# Patient Record
Sex: Male | Born: 1935
Health system: Southern US, Community
[De-identification: ages and names within clinical notes are randomized; demographics above are authoritative.]

## PROBLEM LIST (undated history)

## (undated) DIAGNOSIS — F0280 Dementia in other diseases classified elsewhere without behavioral disturbance: Secondary | ICD-10-CM

## (undated) DIAGNOSIS — S71132S Puncture wound without foreign body, left thigh, sequela: Secondary | ICD-10-CM

## (undated) DIAGNOSIS — H3320 Serous retinal detachment, unspecified eye: Secondary | ICD-10-CM

## (undated) DIAGNOSIS — N183 Chronic kidney disease, stage 3 unspecified: Secondary | ICD-10-CM

## (undated) DIAGNOSIS — M549 Dorsalgia, unspecified: Secondary | ICD-10-CM

## (undated) DIAGNOSIS — M1811 Unilateral primary osteoarthritis of first carpometacarpal joint, right hand: Secondary | ICD-10-CM

## (undated) DIAGNOSIS — E7800 Pure hypercholesterolemia, unspecified: Secondary | ICD-10-CM

## (undated) DIAGNOSIS — S32030A Wedge compression fracture of third lumbar vertebra, initial encounter for closed fracture: Secondary | ICD-10-CM

## (undated) DIAGNOSIS — W3400XS Accidental discharge from unspecified firearms or gun, sequela: Secondary | ICD-10-CM

## (undated) HISTORY — DX: Pure hypercholesterolemia, unspecified: E78.00

## (undated) HISTORY — DX: Serous retinal detachment, unspecified eye: H33.20

## (undated) HISTORY — DX: Dorsalgia, unspecified: M54.9

## (undated) HISTORY — DX: Chronic kidney disease, stage 3 (moderate): N18.3

## (undated) HISTORY — DX: Unilateral primary osteoarthritis of first carpometacarpal joint, right hand: M18.11

## (undated) HISTORY — DX: Puncture wound without foreign body, left thigh, sequela: S71.132S

## (undated) HISTORY — DX: Chronic kidney disease, stage 3 unspecified: N18.30

## (undated) HISTORY — DX: Wedge compression fracture of third lumbar vertebra, initial encounter for closed fracture: S32.030A

## (undated) HISTORY — DX: Accidental discharge from unspecified firearms or gun, sequela: W34.00XS

---
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
## 2008-04-14 DIAGNOSIS — S71132S Puncture wound without foreign body, left thigh, sequela: Secondary | ICD-10-CM

## 2008-04-14 HISTORY — DX: Puncture wound without foreign body, left thigh, sequela: S71.132S

## 2008-09-02 ENCOUNTER — Emergency Department (HOSPITAL_COMMUNITY): Admission: EM | Admit: 2008-09-02 | Discharge: 2008-09-02 | Payer: Self-pay | Admitting: Emergency Medicine

## 2008-09-02 ENCOUNTER — Encounter: Payer: Self-pay | Admitting: Emergency Medicine

## 2010-01-29 LAB — BASIC METABOLIC PANEL: Glucose: 96 mg/dL

## 2010-01-29 LAB — LIPID PANEL
CHOLESTEROL: 211 mg/dL — AB (ref 0–200)
HDL: 56 mg/dL (ref 35–70)
LDL CALC: 137 mg/dL
Triglycerides: 61 mg/dL (ref 40–160)

## 2010-08-13 HISTORY — PX: COLONOSCOPY: SHX174

## 2010-08-13 LAB — HM COLONOSCOPY

## 2011-02-26 LAB — HEPATIC FUNCTION PANEL
ALT: 15 U/L (ref 10–40)
AST: 21 U/L (ref 14–40)
Alkaline Phosphatase: 60 U/L (ref 25–125)
BILIRUBIN, TOTAL: 1.2 mg/dL

## 2011-02-26 LAB — BASIC METABOLIC PANEL
BUN: 24 mg/dL — AB (ref 4–21)
Creatinine: 1.3 mg/dL (ref 0.6–1.3)
GLUCOSE: 84 mg/dL
POTASSIUM: 4.4 mmol/L (ref 3.4–5.3)
SODIUM: 140 mmol/L (ref 137–147)

## 2011-02-26 LAB — LIPID PANEL
Cholesterol: 207 mg/dL — AB (ref 0–200)
HDL: 151 mg/dL — AB (ref 35–70)
LDL CALC: 143 mg/dL

## 2011-09-30 DIAGNOSIS — H43819 Vitreous degeneration, unspecified eye: Secondary | ICD-10-CM | POA: Insufficient documentation

## 2011-09-30 DIAGNOSIS — Z961 Presence of intraocular lens: Secondary | ICD-10-CM | POA: Insufficient documentation

## 2012-04-14 HISTORY — PX: CATARACT EXTRACTION: SUR2

## 2012-04-21 DIAGNOSIS — Z0000 Encounter for general adult medical examination without abnormal findings: Secondary | ICD-10-CM | POA: Diagnosis not present

## 2012-04-21 DIAGNOSIS — E7800 Pure hypercholesterolemia, unspecified: Secondary | ICD-10-CM | POA: Diagnosis not present

## 2012-04-21 DIAGNOSIS — J310 Chronic rhinitis: Secondary | ICD-10-CM | POA: Diagnosis not present

## 2012-04-21 DIAGNOSIS — R7989 Other specified abnormal findings of blood chemistry: Secondary | ICD-10-CM | POA: Diagnosis not present

## 2012-04-21 DIAGNOSIS — Z1331 Encounter for screening for depression: Secondary | ICD-10-CM | POA: Diagnosis not present

## 2012-04-21 LAB — HEPATIC FUNCTION PANEL
ALK PHOS: 67 U/L (ref 25–125)
ALT: 15 U/L (ref 10–40)
AST: 19 U/L (ref 14–40)
Bilirubin, Total: 0.9 mg/dL

## 2012-04-21 LAB — BASIC METABOLIC PANEL
BUN: 24 mg/dL — AB (ref 4–21)
Creatinine: 1.4 mg/dL — AB (ref 0.6–1.3)
Glucose: 79 mg/dL
Potassium: 4.3 mmol/L (ref 3.4–5.3)
SODIUM: 140 mmol/L (ref 137–147)

## 2012-04-21 LAB — LIPID PANEL
CHOLESTEROL: 221 mg/dL — AB (ref 0–200)
HDL: 155 mg/dL — AB (ref 35–70)
LDL CALC: 138 mg/dL

## 2012-10-05 DIAGNOSIS — H2640 Unspecified secondary cataract: Secondary | ICD-10-CM | POA: Diagnosis not present

## 2012-10-05 DIAGNOSIS — Z961 Presence of intraocular lens: Secondary | ICD-10-CM | POA: Diagnosis not present

## 2012-10-05 DIAGNOSIS — H3320 Serous retinal detachment, unspecified eye: Secondary | ICD-10-CM | POA: Diagnosis not present

## 2012-10-05 DIAGNOSIS — H43819 Vitreous degeneration, unspecified eye: Secondary | ICD-10-CM | POA: Diagnosis not present

## 2012-12-20 DIAGNOSIS — H26499 Other secondary cataract, unspecified eye: Secondary | ICD-10-CM | POA: Diagnosis not present

## 2012-12-20 DIAGNOSIS — Z9849 Cataract extraction status, unspecified eye: Secondary | ICD-10-CM | POA: Diagnosis not present

## 2012-12-20 DIAGNOSIS — Z961 Presence of intraocular lens: Secondary | ICD-10-CM | POA: Diagnosis not present

## 2013-04-14 HISTORY — PX: RETINAL TEAR REPAIR CRYOTHERAPY: SHX5304

## 2013-04-20 DIAGNOSIS — Z961 Presence of intraocular lens: Secondary | ICD-10-CM | POA: Diagnosis not present

## 2013-04-20 DIAGNOSIS — H43819 Vitreous degeneration, unspecified eye: Secondary | ICD-10-CM | POA: Diagnosis not present

## 2013-04-26 DIAGNOSIS — Z23 Encounter for immunization: Secondary | ICD-10-CM | POA: Diagnosis not present

## 2013-04-26 DIAGNOSIS — E7800 Pure hypercholesterolemia, unspecified: Secondary | ICD-10-CM | POA: Diagnosis not present

## 2013-04-26 DIAGNOSIS — M7710 Lateral epicondylitis, unspecified elbow: Secondary | ICD-10-CM | POA: Diagnosis not present

## 2013-04-26 DIAGNOSIS — Z1331 Encounter for screening for depression: Secondary | ICD-10-CM | POA: Diagnosis not present

## 2013-04-26 DIAGNOSIS — N183 Chronic kidney disease, stage 3 unspecified: Secondary | ICD-10-CM | POA: Diagnosis not present

## 2013-04-26 DIAGNOSIS — H6980 Other specified disorders of Eustachian tube, unspecified ear: Secondary | ICD-10-CM | POA: Diagnosis not present

## 2013-04-26 LAB — BASIC METABOLIC PANEL
BUN: 19 mg/dL (ref 4–21)
Creatinine: 1.3 mg/dL (ref 0.6–1.3)
GLUCOSE: 88 mg/dL
Potassium: 4.2 mmol/L (ref 3.4–5.3)
SODIUM: 138 mmol/L (ref 137–147)

## 2013-04-26 LAB — HEPATIC FUNCTION PANEL
ALK PHOS: 59 U/L (ref 25–125)
ALT: 11 U/L (ref 10–40)
AST: 17 U/L (ref 14–40)
Bilirubin, Total: 1 mg/dL

## 2013-04-26 LAB — LIPID PANEL
CHOLESTEROL: 188 mg/dL (ref 0–200)
HDL: 59 mg/dL (ref 35–70)
LDL CALC: 112 mg/dL
TRIGLYCERIDES: 82 mg/dL (ref 40–160)

## 2013-06-11 ENCOUNTER — Ambulatory Visit (INDEPENDENT_AMBULATORY_CARE_PROVIDER_SITE_OTHER): Payer: Medicare Other | Admitting: Family Medicine

## 2013-06-11 VITALS — BP 118/78 | HR 76 | Temp 98.0°F | Resp 16 | Ht 71.0 in | Wt 183.0 lb

## 2013-06-11 DIAGNOSIS — J0100 Acute maxillary sinusitis, unspecified: Secondary | ICD-10-CM | POA: Diagnosis not present

## 2013-06-11 DIAGNOSIS — R05 Cough: Secondary | ICD-10-CM | POA: Diagnosis not present

## 2013-06-11 DIAGNOSIS — R059 Cough, unspecified: Secondary | ICD-10-CM | POA: Diagnosis not present

## 2013-06-11 MED ORDER — AMOXICILLIN-POT CLAVULANATE 875-125 MG PO TABS: 1.0000 | ORAL_TABLET | Freq: Two times a day (BID) | ORAL | Status: DC

## 2013-06-11 MED ORDER — IPRATROPIUM BROMIDE 0.03 % NA SOLN: 2.0000 | Freq: Two times a day (BID) | NASAL | Status: DC

## 2013-06-11 MED ORDER — BENZONATATE 100 MG PO CAPS: 100.0000 mg | ORAL_CAPSULE | Freq: Three times a day (TID) | ORAL | Status: DC | PRN

## 2013-06-11 NOTE — Patient Instructions (Signed)

## 2013-06-11 NOTE — Progress Notes (Addendum)
Subjective:    Patient ID: Alfred Clark, male    DOB: 08-17-1935, 78 y.o.   MRN: [id number]  HPI Chief Complaint  Patient presents with  . Cough    x 1 week    This chart was scribed for Reginia Forts, MD by Thea Alken, ED Scribe. This patient was seen in room 3 and the patient's care was started at 8:46 AM.  HPI Comments: Alfred Clark is a 78 y.o. male who presents to the [facility] complaining of a productive cough consisting of light brown liquid onset 10 days, but worsened 3 days ago. He reports that he has coughing fits, chest congestion and reports burning when coughing. He states that he coughs more during the night than the day. He also reports chronic rhinorrhea. Pt reports that he has taken Mucinex. He states that he takes  2 in the morning and 2 at night.   Pt states that he has congestion in his ears but reports ear problems chronically due to years of flying. Pt reports congestion worse in right ear than left. Pt wears a hearing aid. Pt received flu shot 2 months ago and is due for a pneumonia vaccine next year.  Pt has family history of glaucoma. He reports bilateral cataract surgery.   He denies sore throat, fever, diaphoresis, SOB  and chills.  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    History reviewed. No pertinent past medical history. Allergies  Allergen Reactions  . Sulfa Antibiotics Rash   Prior to Admission medications   Not on File   Review of Systems  Constitutional: Negative for fever, chills and diaphoresis.  HENT: Positive for congestion and rhinorrhea. Negative for ear pain and sore throat.   Respiratory: Positive for cough. Negative for shortness of breath.    History   Social History  . Marital Status: Married    Spouse Name: N/A    Number of Children: N/A  . Years of Education: N/A   Occupational History  . Not on file.   Social History Main Topics  . Smoking status: Never Smoker   .  Smokeless tobacco: Not on file  . Alcohol Use: No  . Drug Use: No  . Sexual Activity: Yes   Other Topics Concern  . Not on file   Social History Narrative  . No narrative on file       Objective:   Physical Exam  Nursing note and vitals reviewed. Constitutional: He is oriented to person, place, and time. He appears well-developed and well-nourished. No distress.  HENT:  Head: Normocephalic and atraumatic.  Nose: Rhinorrhea present.  Mouth/Throat: No oropharyngeal exudate, posterior oropharyngeal edema or posterior oropharyngeal erythema.  Eyes: EOM are normal.  Neck: Neck supple. No tracheal deviation present.  No sinus tenderness  Cardiovascular: Normal rate, regular rhythm and normal heart sounds.  Exam reveals no gallop and no friction rub.   No murmur heard. Pulmonary/Chest: Effort normal and breath sounds normal. No respiratory distress. He has no wheezes. He has no rales.  Scattered bronchi on right. Good air movement  Musculoskeletal: Normal range of motion.  Lymphadenopathy:    He has no cervical adenopathy.  Neurological: He is alert and oriented to person, place, and time.  Skin: Skin is warm and dry.  Psychiatric: He has a normal mood and affect. His behavior is normal.   Filed Vitals:   06/11/13 0810  BP: 118/78  Pulse: 76  Temp: 98 F (36.7 C)  Resp: 16      Assessment & Plan:   1. Acute maxillary sinusitis   2. Cough    1. Acute maxillary sinusitis:  New.  Rx for Augmentin, Atrovent nasal spray. Continue Mucinex DM. 2.  Cough:  New.  Worse at night; continue Mucinex DM; rx for Tessalon Perles provided.  RTC for SOB, acute worsening. Meds ordered this encounter  Medications  . amoxicillin-clavulanate (AUGMENTIN) 875-125 MG per tablet    Sig: Take 1 tablet by mouth 2 (two) times daily.    Dispense:  20 tablet    Refill:  0  . ipratropium (ATROVENT) 0.03 % nasal spray    Sig: Place 2 sprays into the nose 2 (two) times daily.    Dispense:  30 mL     Refill:  0  . benzonatate (TESSALON) 100 MG capsule    Sig: Take 1-2 capsules (100-200 mg total) by mouth 3 (three) times daily as needed for cough.    Dispense:  40 capsule    Refill:  0   I personally performed the services described in this documentation, which was scribed in my presence.  The recorded information has been reviewed and is accurate.  Reginia Forts, M.D.  [facility] [address] [place], [place]  [address] [phone] phone [phone] fax

## 2013-10-26 DIAGNOSIS — H43819 Vitreous degeneration, unspecified eye: Secondary | ICD-10-CM | POA: Diagnosis not present

## 2013-10-26 DIAGNOSIS — H2640 Unspecified secondary cataract: Secondary | ICD-10-CM | POA: Diagnosis not present

## 2013-10-26 DIAGNOSIS — Z961 Presence of intraocular lens: Secondary | ICD-10-CM | POA: Diagnosis not present

## 2013-10-26 DIAGNOSIS — H3320 Serous retinal detachment, unspecified eye: Secondary | ICD-10-CM | POA: Diagnosis not present

## 2014-02-08 ENCOUNTER — Ambulatory Visit (INDEPENDENT_AMBULATORY_CARE_PROVIDER_SITE_OTHER): Payer: Medicare Other | Admitting: Family Medicine

## 2014-02-08 VITALS — BP 110/70 | HR 70 | Temp 98.4°F | Resp 16 | Ht 71.5 in | Wt 175.0 lb

## 2014-02-08 DIAGNOSIS — J209 Acute bronchitis, unspecified: Secondary | ICD-10-CM

## 2014-02-08 MED ORDER — LEVOFLOXACIN 500 MG PO TABS: 500.0000 mg | ORAL_TABLET | Freq: Every day | ORAL | Status: DC

## 2014-02-08 MED ORDER — HYDROCOD POLST-CHLORPHEN POLST 10-8 MG/5ML PO LQCR: 5.0000 mL | Freq: Two times a day (BID) | ORAL | Status: DC | PRN

## 2014-02-08 NOTE — Patient Instructions (Signed)

## 2014-02-08 NOTE — Progress Notes (Signed)
° °  Subjective:    Patient ID: Alfred Clark, male    DOB: [birth year], 77 y.o.   MRN: [id number] This chart was scribed for Robyn Haber, MD by Marti Sleigh, Medical Scribe. This patient was seen in Room 4 and the patient's care was started at 11:59 AM.  HPI HPI Comments: Alfred Clark is a 78 y.o. male with a past hx of sinus congestion who presents to [facility] complaining of persistent productive cough that started five days ago. Pt endorses associated ear pain. Pt states he is coughing up a light brown drainage. Pt states his wife has parkinson's and Lewy Bodies Disease and wakes him up several times per night. Pt denies fever or chills.   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    Review of Systems  Constitutional: Negative for fever and chills.  HENT: Positive for congestion, rhinorrhea and sinus pressure.   Respiratory: Positive for cough and chest tightness.   Gastrointestinal: Negative for nausea and vomiting.      Objective:   Physical Exam  Nursing note and vitals reviewed. Constitutional: He is oriented to person, place, and time. He appears well-developed and well-nourished.  HENT:  Head: Normocephalic and atraumatic.  Mouth/Throat: Posterior oropharyngeal erythema present.  Hearing aids in both ears. Posterior pharynx severely erythematic.  Eyes: Pupils are equal, round, and reactive to light.  Neck: No JVD present.  Cardiovascular: Normal rate and regular rhythm.   Pulmonary/Chest: Effort normal and breath sounds normal. No respiratory distress.  Diffuse rhonchi in both lungs.  Neurological: He is alert and oriented to person, place, and time.  Skin: Skin is warm and dry.  Psychiatric: He has a normal mood and affect. His behavior is normal.       Assessment & Plan:    Acute bronchitis, unspecified organism - Plan: levofloxacin  (LEVAQUIN) 500 MG tablet, chlorpheniramine-HYDROcodone (TUSSIONEX PENNKINETIC ER) 10-8 MG/5ML LQCR  Signed, Robyn Haber, MD

## 2014-04-27 DIAGNOSIS — L989 Disorder of the skin and subcutaneous tissue, unspecified: Secondary | ICD-10-CM | POA: Diagnosis not present

## 2014-04-27 DIAGNOSIS — Z1389 Encounter for screening for other disorder: Secondary | ICD-10-CM | POA: Diagnosis not present

## 2014-04-27 DIAGNOSIS — L570 Actinic keratosis: Secondary | ICD-10-CM | POA: Diagnosis not present

## 2014-04-27 DIAGNOSIS — Z125 Encounter for screening for malignant neoplasm of prostate: Secondary | ICD-10-CM | POA: Diagnosis not present

## 2014-04-27 DIAGNOSIS — N183 Chronic kidney disease, stage 3 (moderate): Secondary | ICD-10-CM | POA: Diagnosis not present

## 2014-04-27 DIAGNOSIS — Z0000 Encounter for general adult medical examination without abnormal findings: Secondary | ICD-10-CM | POA: Diagnosis not present

## 2014-04-27 DIAGNOSIS — Z23 Encounter for immunization: Secondary | ICD-10-CM | POA: Diagnosis not present

## 2014-04-27 DIAGNOSIS — E780 Pure hypercholesterolemia: Secondary | ICD-10-CM | POA: Diagnosis not present

## 2014-05-25 DIAGNOSIS — D1801 Hemangioma of skin and subcutaneous tissue: Secondary | ICD-10-CM | POA: Diagnosis not present

## 2014-05-25 DIAGNOSIS — L821 Other seborrheic keratosis: Secondary | ICD-10-CM | POA: Diagnosis not present

## 2014-05-25 DIAGNOSIS — D225 Melanocytic nevi of trunk: Secondary | ICD-10-CM | POA: Diagnosis not present

## 2014-05-25 DIAGNOSIS — L570 Actinic keratosis: Secondary | ICD-10-CM | POA: Diagnosis not present

## 2014-12-04 DIAGNOSIS — R58 Hemorrhage, not elsewhere classified: Secondary | ICD-10-CM | POA: Diagnosis not present

## 2014-12-04 DIAGNOSIS — W57XXXA Bitten or stung by nonvenomous insect and other nonvenomous arthropods, initial encounter: Secondary | ICD-10-CM | POA: Diagnosis not present

## 2015-01-31 DIAGNOSIS — Z23 Encounter for immunization: Secondary | ICD-10-CM | POA: Diagnosis not present

## 2015-02-06 DIAGNOSIS — H43392 Other vitreous opacities, left eye: Secondary | ICD-10-CM | POA: Diagnosis not present

## 2015-02-06 DIAGNOSIS — H40003 Preglaucoma, unspecified, bilateral: Secondary | ICD-10-CM | POA: Diagnosis not present

## 2015-02-06 DIAGNOSIS — H40059 Ocular hypertension, unspecified eye: Secondary | ICD-10-CM | POA: Diagnosis not present

## 2015-02-06 DIAGNOSIS — H43813 Vitreous degeneration, bilateral: Secondary | ICD-10-CM | POA: Diagnosis not present

## 2015-02-06 DIAGNOSIS — Z961 Presence of intraocular lens: Secondary | ICD-10-CM | POA: Diagnosis not present

## 2015-02-06 DIAGNOSIS — H35372 Puckering of macula, left eye: Secondary | ICD-10-CM | POA: Insufficient documentation

## 2015-02-06 DIAGNOSIS — H3322 Serous retinal detachment, left eye: Secondary | ICD-10-CM | POA: Diagnosis not present

## 2015-02-06 DIAGNOSIS — Z0101 Encounter for examination of eyes and vision with abnormal findings: Secondary | ICD-10-CM | POA: Diagnosis not present

## 2015-06-08 ENCOUNTER — Telehealth: Payer: Self-pay

## 2015-06-08 DIAGNOSIS — J209 Acute bronchitis, unspecified: Secondary | ICD-10-CM | POA: Diagnosis not present

## 2015-06-08 DIAGNOSIS — H1033 Unspecified acute conjunctivitis, bilateral: Secondary | ICD-10-CM | POA: Diagnosis not present

## 2015-06-08 NOTE — Telephone Encounter (Signed)
Patient was calling stating he thinks he has pneumonia. Patient c/o productive cough (green) x 1 week, worse at night, eyes red. Patient denied fever.  I explained to patient that he would need to seek medical help at urgent care for he is not a patient at our office. (patient's wife is our patient).  Patient informed that we will bring him a new patient packet to [facility] on Tuesday so that he can establish with [place].

## 2015-07-24 DIAGNOSIS — H401133 Primary open-angle glaucoma, bilateral, severe stage: Secondary | ICD-10-CM | POA: Diagnosis not present

## 2015-07-24 DIAGNOSIS — Z961 Presence of intraocular lens: Secondary | ICD-10-CM | POA: Diagnosis not present

## 2015-07-24 DIAGNOSIS — H47393 Other disorders of optic disc, bilateral: Secondary | ICD-10-CM | POA: Diagnosis not present

## 2015-07-24 DIAGNOSIS — H40053 Ocular hypertension, bilateral: Secondary | ICD-10-CM | POA: Diagnosis not present

## 2016-01-29 DIAGNOSIS — H40053 Ocular hypertension, bilateral: Secondary | ICD-10-CM | POA: Diagnosis not present

## 2016-01-29 DIAGNOSIS — Z961 Presence of intraocular lens: Secondary | ICD-10-CM | POA: Diagnosis not present

## 2016-02-17 DIAGNOSIS — H17813 Minor opacity of cornea, bilateral: Secondary | ICD-10-CM | POA: Insufficient documentation

## 2016-02-18 DIAGNOSIS — Z961 Presence of intraocular lens: Secondary | ICD-10-CM | POA: Diagnosis not present

## 2016-02-18 DIAGNOSIS — H17813 Minor opacity of cornea, bilateral: Secondary | ICD-10-CM | POA: Diagnosis not present

## 2016-02-18 DIAGNOSIS — Z9842 Cataract extraction status, left eye: Secondary | ICD-10-CM | POA: Diagnosis not present

## 2016-02-18 DIAGNOSIS — H538 Other visual disturbances: Secondary | ICD-10-CM | POA: Diagnosis not present

## 2016-02-18 DIAGNOSIS — Z9841 Cataract extraction status, right eye: Secondary | ICD-10-CM | POA: Diagnosis not present

## 2016-02-27 DIAGNOSIS — Z23 Encounter for immunization: Secondary | ICD-10-CM | POA: Diagnosis not present

## 2016-03-10 DIAGNOSIS — H40053 Ocular hypertension, bilateral: Secondary | ICD-10-CM | POA: Diagnosis not present

## 2016-06-04 ENCOUNTER — Telehealth: Payer: Self-pay | Admitting: Internal Medicine

## 2016-06-04 NOTE — Telephone Encounter (Signed)
Ok, clearly not my patient then.  Thanks.

## 2016-07-16 ENCOUNTER — Non-Acute Institutional Stay: Payer: Medicare Other | Admitting: Internal Medicine

## 2016-07-16 ENCOUNTER — Encounter: Payer: Self-pay | Admitting: Internal Medicine

## 2016-07-16 VITALS — BP 138/80 | HR 58 | Temp 97.8°F | Ht 71.0 in | Wt 185.0 lb

## 2016-07-16 DIAGNOSIS — H40053 Ocular hypertension, bilateral: Secondary | ICD-10-CM

## 2016-07-16 DIAGNOSIS — H903 Sensorineural hearing loss, bilateral: Secondary | ICD-10-CM | POA: Diagnosis not present

## 2016-07-16 DIAGNOSIS — Z7189 Other specified counseling: Secondary | ICD-10-CM

## 2016-07-16 DIAGNOSIS — R21 Rash and other nonspecific skin eruption: Secondary | ICD-10-CM

## 2016-07-16 DIAGNOSIS — Z636 Dependent relative needing care at home: Secondary | ICD-10-CM
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
## 2016-07-17 ENCOUNTER — Encounter: Payer: Self-pay | Admitting: Internal Medicine

## 2016-07-17 DIAGNOSIS — M1811 Unilateral primary osteoarthritis of first carpometacarpal joint, right hand: Secondary | ICD-10-CM | POA: Insufficient documentation

## 2016-07-17 DIAGNOSIS — N183 Chronic kidney disease, stage 3 unspecified: Secondary | ICD-10-CM | POA: Insufficient documentation

## 2016-07-17 DIAGNOSIS — N1832 Chronic kidney disease, stage 3b: Secondary | ICD-10-CM | POA: Insufficient documentation

## 2016-07-17 DIAGNOSIS — E7800 Pure hypercholesterolemia, unspecified: Secondary | ICD-10-CM | POA: Insufficient documentation

## 2016-07-17 DIAGNOSIS — S32030A Wedge compression fracture of third lumbar vertebra, initial encounter for closed fracture: Secondary | ICD-10-CM | POA: Insufficient documentation

## 2016-09-10 DIAGNOSIS — H40053 Ocular hypertension, bilateral: Secondary | ICD-10-CM | POA: Diagnosis not present

## 2016-10-14 ENCOUNTER — Non-Acute Institutional Stay: Payer: Medicare Other

## 2016-10-14 VITALS — BP 120/58 | HR 76 | Temp 97.3°F | Ht 71.0 in | Wt 185.0 lb

## 2016-10-14 DIAGNOSIS — Z0000 Encounter for general adult medical examination without abnormal findings: Secondary | ICD-10-CM
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
## 2016-10-14 NOTE — Patient Instructions (Signed)
Alfred Clark , Thank you for taking time to come for your Medicare Wellness Visit. I appreciate your ongoing commitment to your health goals. Please review the following plan we discussed and let me know if I can assist you in the future.   Screening recommendations/referrals: Colonoscopy up to date, pt over age 79 Recommended yearly ophthalmology/optometry visit for glaucoma screening and checkup Recommended yearly dental visit for hygiene and checkup  Vaccinations: Influenza vaccine up to date. Due 12/16/16 Pneumococcal vaccine up to date Tdap vaccine up to date. Due 11/01/2018 Shingles vaccine due. If you decide you want this vaccine we can send prescription to your pharmacy  Advanced directives: In chart  Conditions/risks identified: None  Next appointment: Dr. Mariea Clonts 10/22/16 @ 10am  Preventive Care 65 Years and Older, Male Preventive care refers to lifestyle choices and visits with your health care provider that can promote health and wellness. What does preventive care include?  A yearly physical exam. This is also called an annual well check.  Dental exams once or twice a year.  Routine eye exams. Ask your health care provider how often you should have your eyes checked.  Personal lifestyle choices, including:  Daily care of your teeth and gums.  Regular physical activity.  Eating a healthy diet.  Avoiding tobacco and drug use.  Limiting alcohol use.  Practicing safe sex.  Taking low doses of aspirin every day.  Taking vitamin and mineral supplements as recommended by your health care provider. What happens during an annual well check? The services and screenings done by your health care provider during your annual well check will depend on your age, overall health, lifestyle risk factors, and family history of disease. Counseling  Your health care provider may ask you questions about your:  Alcohol use.  Tobacco use.  Drug use.  Emotional  well-being.  Home and relationship well-being.  Sexual activity.  Eating habits.  History of falls.  Memory and ability to understand (cognition).  Work and work Statistician. Screening  You may have the following tests or measurements:  Height, weight, and BMI.  Blood pressure.  Lipid and cholesterol levels. These may be checked every 5 years, or more frequently if you are over 18 years old.  Skin check.  Lung cancer screening. You may have this screening every year starting at age 26 if you have a 30-pack-year history of smoking and currently smoke or have quit within the past 15 years.  Fecal occult blood test (FOBT) of the stool. You may have this test every year starting at age 56.  Flexible sigmoidoscopy or colonoscopy. You may have a sigmoidoscopy every 5 years or a colonoscopy every 10 years starting at age 23.  Prostate cancer screening. Recommendations will vary depending on your family history and other risks.  Hepatitis C blood test.  Hepatitis B blood test.  Sexually transmitted disease (STD) testing.  Diabetes screening. This is done by checking your blood sugar (glucose) after you have not eaten for a while (fasting). You may have this done every 1-3 years.  Abdominal aortic aneurysm (AAA) screening. You may need this if you are a current or former smoker.  Osteoporosis. You may be screened starting at age 28 if you are at high risk. Talk with your health care provider about your test results, treatment options, and if necessary, the need for more tests. Vaccines  Your health care provider may recommend certain vaccines, such as:  Influenza vaccine. This is recommended every year.  Tetanus, diphtheria,  and acellular pertussis (Tdap, Td) vaccine. You may need a Td booster every 10 years.  Zoster vaccine. You may need this after age 50.  Pneumococcal 13-valent conjugate (PCV13) vaccine. One dose is recommended after age 14.  Pneumococcal  polysaccharide (PPSV23) vaccine. One dose is recommended after age 1. Talk to your health care provider about which screenings and vaccines you need and how often you need them. This information is not intended to replace advice given to you by your health care provider. Make sure you discuss any questions you have with your health care provider. Document Released: 04/27/2015 Document Revised: 12/19/2015 Document Reviewed: 01/30/2015 Elsevier Interactive Patient Education  2017 [place] Prevention in the Home Falls can cause injuries. They can happen to people of all ages. There are many things you can do to make your home safe and to help prevent falls. What can I do on the outside of my home?  Regularly fix the edges of walkways and driveways and fix any cracks.  Remove anything that might make you trip as you walk through a door, such as a raised step or threshold.  Trim any bushes or trees on the path to your home.  Use bright outdoor lighting.  Clear any walking paths of anything that might make someone trip, such as rocks or tools.  Regularly check to see if handrails are loose or broken. Make sure that both sides of any steps have handrails.  Any raised decks and porches should have guardrails on the edges.  Have any leaves, snow, or ice cleared regularly.  Use sand or salt on walking paths during winter.  Clean up any spills in your garage right away. This includes oil or grease spills. What can I do in the bathroom?  Use night lights.  Install grab bars by the toilet and in the tub and shower. Do not use towel bars as grab bars.  Use non-skid mats or decals in the tub or shower.  If you need to sit down in the shower, use a plastic, non-slip stool.  Keep the floor dry. Clean up any water that spills on the floor as soon as it happens.  Remove soap buildup in the tub or shower regularly.  Attach bath mats securely with double-sided non-slip rug  tape.  Do not have throw rugs and other things on the floor that can make you trip. What can I do in the bedroom?  Use night lights.  Make sure that you have a light by your bed that is easy to reach.  Do not use any sheets or blankets that are too big for your bed. They should not hang down onto the floor.  Have a firm chair that has side arms. You can use this for support while you get dressed.  Do not have throw rugs and other things on the floor that can make you trip. What can I do in the kitchen?  Clean up any spills right away.  Avoid walking on wet floors.  Keep items that you use a lot in easy-to-reach places.  If you need to reach something above you, use a strong step stool that has a grab bar.  Keep electrical cords out of the way.  Do not use floor polish or wax that makes floors slippery. If you must use wax, use non-skid floor wax.  Do not have throw rugs and other things on the floor that can make you trip. What can I do with my  stairs?  Do not leave any items on the stairs.  Make sure that there are handrails on both sides of the stairs and use them. Fix handrails that are broken or loose. Make sure that handrails are as long as the stairways.  Check any carpeting to make sure that it is firmly attached to the stairs. Fix any carpet that is loose or worn.  Avoid having throw rugs at the top or bottom of the stairs. If you do have throw rugs, attach them to the floor with carpet tape.  Make sure that you have a light switch at the top of the stairs and the bottom of the stairs. If you do not have them, ask someone to add them for you. What else can I do to help prevent falls?  Wear shoes that:  Do not have high heels.  Have rubber bottoms.  Are comfortable and fit you well.  Are closed at the toe. Do not wear sandals.  If you use a stepladder:  Make sure that it is fully opened. Do not climb a closed stepladder.  Make sure that both sides of the  stepladder are locked into place.  Ask someone to hold it for you, if possible.  Clearly mark and make sure that you can see:  Any grab bars or handrails.  First and last steps.  Where the edge of each step is.  Use tools that help you move around (mobility aids) if they are needed. These include:  Canes.  Walkers.  Scooters.  Crutches.  Turn on the lights when you go into a dark area. Replace any light bulbs as soon as they burn out.  Set up your furniture so you have a clear path. Avoid moving your furniture around.  If any of your floors are uneven, fix them.  If there are any pets around you, be aware of where they are.  Review your medicines with your doctor. Some medicines can make you feel dizzy. This can increase your chance of falling. Ask your doctor what other things that you can do to help prevent falls. This information is not intended to replace advice given to you by your health care provider. Make sure you discuss any questions you have with your health care provider. Document Released: 01/25/2009 Document Revised: 09/06/2015 Document Reviewed: 05/05/2014 Elsevier Interactive Patient Education  2017 [employer].

## 2016-10-22 ENCOUNTER — Encounter: Payer: Self-pay | Admitting: Internal Medicine

## 2016-10-22 ENCOUNTER — Non-Acute Institutional Stay: Payer: Medicare Other | Admitting: Internal Medicine

## 2016-10-22 VITALS — BP 120/60 | HR 60 | Temp 98.0°F | Wt 180.0 lb

## 2016-10-22 DIAGNOSIS — Z23 Encounter for immunization: Secondary | ICD-10-CM | POA: Diagnosis not present

## 2016-10-22 DIAGNOSIS — R4789 Other speech disturbances: Secondary | ICD-10-CM

## 2016-10-22 DIAGNOSIS — H903 Sensorineural hearing loss, bilateral: Secondary | ICD-10-CM | POA: Diagnosis not present

## 2016-10-22 DIAGNOSIS — H40053 Ocular hypertension, bilateral: Secondary | ICD-10-CM

## 2016-10-22 DIAGNOSIS — N183 Chronic kidney disease, stage 3 unspecified: Secondary | ICD-10-CM

## 2016-10-22 DIAGNOSIS — E7800 Pure hypercholesterolemia, unspecified: Secondary | ICD-10-CM

## 2016-10-22 MED ORDER — ZOSTER VAC RECOMB ADJUVANTED 50 MCG/0.5ML IM SUSR: 0.5000 mL | Freq: Once | INTRAMUSCULAR | 1 refills | Status: AC
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
## 2016-10-23 DIAGNOSIS — E7800 Pure hypercholesterolemia, unspecified: Secondary | ICD-10-CM | POA: Diagnosis not present

## 2016-10-23 DIAGNOSIS — D649 Anemia, unspecified: Secondary | ICD-10-CM | POA: Diagnosis not present

## 2016-10-23 DIAGNOSIS — E785 Hyperlipidemia, unspecified: Secondary | ICD-10-CM | POA: Diagnosis not present

## 2016-10-23 DIAGNOSIS — H40053 Ocular hypertension, bilateral: Secondary | ICD-10-CM | POA: Diagnosis not present

## 2016-10-23 LAB — LIPID PANEL
CHOLESTEROL: 182 (ref 0–200)
HDL: 64 (ref 35–70)
LDL CALC: 107
Triglycerides: 59 (ref 40–160)

## 2016-10-23 LAB — CBC AND DIFFERENTIAL
HEMATOCRIT: 41 (ref 41–53)
HEMOGLOBIN: 13.3 — AB (ref 13.5–17.5)
PLATELETS: 245 (ref 150–399)
WBC: 7

## 2016-10-23 LAB — BASIC METABOLIC PANEL
BUN: 23 — AB (ref 4–21)
Creatinine: 1.5 — AB (ref 0.6–1.3)
GLUCOSE: 91
POTASSIUM: 4.4 (ref 3.4–5.3)
Sodium: 139 (ref 137–147)

## 2016-10-23 LAB — HEPATIC FUNCTION PANEL
ALK PHOS: 64 (ref 25–125)
ALT: 10 (ref 10–40)
AST: 17 (ref 14–40)

## 2016-10-24 ENCOUNTER — Encounter: Payer: Self-pay | Admitting: *Deleted

## 2016-10-27 DIAGNOSIS — H40053 Ocular hypertension, bilateral: Secondary | ICD-10-CM | POA: Diagnosis not present

## 2017-01-30 DIAGNOSIS — Z23 Encounter for immunization: Secondary | ICD-10-CM | POA: Diagnosis not present

## 2017-04-22 ENCOUNTER — Encounter: Payer: Self-pay | Admitting: Internal Medicine

## 2017-04-22 ENCOUNTER — Non-Acute Institutional Stay: Payer: Medicare Other | Admitting: Internal Medicine

## 2017-04-22 VITALS — BP 110/60 | HR 61 | Temp 98.3°F | Wt 182.0 lb

## 2017-04-22 DIAGNOSIS — N183 Chronic kidney disease, stage 3 unspecified: Secondary | ICD-10-CM

## 2017-04-22 DIAGNOSIS — H40053 Ocular hypertension, bilateral: Secondary | ICD-10-CM

## 2017-04-22 DIAGNOSIS — E7800 Pure hypercholesterolemia, unspecified: Secondary | ICD-10-CM

## 2017-04-22 DIAGNOSIS — H903 Sensorineural hearing loss, bilateral: Secondary | ICD-10-CM

## 2017-04-22 DIAGNOSIS — Z636 Dependent relative needing care at home: Secondary | ICD-10-CM
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
## 2017-04-29 DIAGNOSIS — H17813 Minor opacity of cornea, bilateral: Secondary | ICD-10-CM | POA: Diagnosis not present

## 2017-04-29 DIAGNOSIS — Z961 Presence of intraocular lens: Secondary | ICD-10-CM | POA: Diagnosis not present

## 2017-04-29 DIAGNOSIS — Z9889 Other specified postprocedural states: Secondary | ICD-10-CM | POA: Diagnosis not present

## 2017-04-29 DIAGNOSIS — Z48810 Encounter for surgical aftercare following surgery on the sense organs: Secondary | ICD-10-CM | POA: Diagnosis not present

## 2017-04-29 DIAGNOSIS — H43813 Vitreous degeneration, bilateral: Secondary | ICD-10-CM | POA: Diagnosis not present

## 2017-05-18 NOTE — Telephone Encounter (Signed)
Handled..cdavis  °

## 2017-10-13 DIAGNOSIS — E7800 Pure hypercholesterolemia, unspecified: Secondary | ICD-10-CM | POA: Diagnosis not present

## 2017-10-13 DIAGNOSIS — H40053 Ocular hypertension, bilateral: Secondary | ICD-10-CM | POA: Diagnosis not present

## 2017-10-13 DIAGNOSIS — E785 Hyperlipidemia, unspecified: Secondary | ICD-10-CM | POA: Diagnosis not present

## 2017-10-13 DIAGNOSIS — N183 Chronic kidney disease, stage 3 (moderate): Secondary | ICD-10-CM | POA: Diagnosis not present

## 2017-10-13 LAB — BASIC METABOLIC PANEL
BUN: 23 — AB (ref 4–21)
Creatinine: 1.4 — AB (ref 0.6–1.3)
Glucose: 103
Potassium: 4.4 (ref 3.4–5.3)
Sodium: 142 (ref 137–147)

## 2017-10-13 LAB — CBC AND DIFFERENTIAL
HCT: 42 (ref 41–53)
Hemoglobin: 13.7 (ref 13.5–17.5)
Platelets: 292 (ref 150–399)
WBC: 7.4

## 2017-10-13 LAB — HEPATIC FUNCTION PANEL
ALT: 13 (ref 10–40)
AST: 19 (ref 14–40)
Alkaline Phosphatase: 74 (ref 25–125)
Bilirubin, Total: 1.1

## 2017-10-13 LAB — LIPID PANEL
Cholesterol: 204 — AB (ref 0–200)
HDL: 73 — AB (ref 35–70)
LDL Cholesterol: 120
Triglycerides: 52 (ref 40–160)

## 2017-10-14 ENCOUNTER — Telehealth: Payer: Self-pay | Admitting: Internal Medicine

## 2017-10-14 NOTE — Telephone Encounter (Signed)
I called the patient to schedule AWV with Clarise Cruz at [facility] on 10/20/17 if available. He said that he will call me back when he's near the calendar.  If patient calls the office, please scheduled him with Clarise Cruz at [facility] on 10/20/17. VDM (DD)

## 2017-10-16 ENCOUNTER — Encounter: Payer: Self-pay | Admitting: Internal Medicine

## 2017-10-20 ENCOUNTER — Non-Acute Institutional Stay: Payer: Medicare Other

## 2017-10-20 VITALS — BP 138/64 | HR 64 | Temp 98.0°F | Ht 71.0 in | Wt 181.0 lb

## 2017-10-20 DIAGNOSIS — Z0000 Encounter for general adult medical examination without abnormal findings: Secondary | ICD-10-CM

## 2017-10-20 MED ORDER — ZOSTER VAC RECOMB ADJUVANTED 50 MCG/0.5ML IM SUSR: 0.5000 mL | Freq: Once | INTRAMUSCULAR | 1 refills | Status: AC
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
## 2017-10-20 NOTE — Patient Instructions (Signed)
Alfred Clark , Thank you for taking time to come for your Medicare Wellness Visit. I appreciate your ongoing commitment to your health goals. Please review the following plan we discussed and let me know if I can assist you in the future.   Screening recommendations/referrals: Colonoscopy excluded, over age 80 Recommended yearly ophthalmology/optometry visit for glaucoma screening and checkup Recommended yearly dental visit for hygiene and checkup  Vaccinations: Influenza vaccine due 2019 fall season Pneumococcal vaccine up to date, completed Tdap vaccine up to date, due 11/01/2018 Shingles vaccine due, ordered to [facility]    Advanced directives: in chart  Conditions/risks identified: none  Next appointment: Dr. Mariea Clonts 10/21/2017 @ 9am  Preventive Care 19 Years and Older, Male Preventive care refers to lifestyle choices and visits with your health care provider that can promote health and wellness. What does preventive care include?  A yearly physical exam. This is also called an annual well check.  Dental exams once or twice a year.  Routine eye exams. Ask your health care provider how often you should have your eyes checked.  Personal lifestyle choices, including:  Daily care of your teeth and gums.  Regular physical activity.  Eating a healthy diet.  Avoiding tobacco and drug use.  Limiting alcohol use.  Practicing safe sex.  Taking low doses of aspirin every day.  Taking vitamin and mineral supplements as recommended by your health care provider. What happens during an annual well check? The services and screenings done by your health care provider during your annual well check will depend on your age, overall health, lifestyle risk factors, and family history of disease. Counseling  Your health care provider may ask you questions about your:  Alcohol use.  Tobacco use.  Drug use.  Emotional well-being.  Home and relationship well-being.  Sexual  activity.  Eating habits.  History of falls.  Memory and ability to understand (cognition).  Work and work Statistician. Screening  You may have the following tests or measurements:  Height, weight, and BMI.  Blood pressure.  Lipid and cholesterol levels. These may be checked every 5 years, or more frequently if you are over 43 years old.  Skin check.  Lung cancer screening. You may have this screening every year starting at age 80 if you have a 30-pack-year history of smoking and currently smoke or have quit within the past 15 years.  Fecal occult blood test (FOBT) of the stool. You may have this test every year starting at age 48.  Flexible sigmoidoscopy or colonoscopy. You may have a sigmoidoscopy every 5 years or a colonoscopy every 10 years starting at age 59.  Prostate cancer screening. Recommendations will vary depending on your family history and other risks.  Hepatitis C blood test.  Hepatitis B blood test.  Sexually transmitted disease (STD) testing.  Diabetes screening. This is done by checking your blood sugar (glucose) after you have not eaten for a while (fasting). You may have this done every 1-3 years.  Abdominal aortic aneurysm (AAA) screening. You may need this if you are a current or former smoker.  Osteoporosis. You may be screened starting at age 44 if you are at high risk. Talk with your health care provider about your test results, treatment options, and if necessary, the need for more tests. Vaccines  Your health care provider may recommend certain vaccines, such as:  Influenza vaccine. This is recommended every year.  Tetanus, diphtheria, and acellular pertussis (Tdap, Td) vaccine. You may need a Td booster  every 10 years.  Zoster vaccine. You may need this after age 22.  Pneumococcal 13-valent conjugate (PCV13) vaccine. One dose is recommended after age 14.  Pneumococcal polysaccharide (PPSV23) vaccine. One dose is recommended after age  75. Talk to your health care provider about which screenings and vaccines you need and how often you need them. This information is not intended to replace advice given to you by your health care provider. Make sure you discuss any questions you have with your health care provider. Document Released: 04/27/2015 Document Revised: 12/19/2015 Document Reviewed: 01/30/2015 Elsevier Interactive Patient Education  2017 [place] Prevention in the Home Falls can cause injuries. They can happen to people of all ages. There are many things you can do to make your home safe and to help prevent falls. What can I do on the outside of my home?  Regularly fix the edges of walkways and driveways and fix any cracks.  Remove anything that might make you trip as you walk through a door, such as a raised step or threshold.  Trim any bushes or trees on the path to your home.  Use bright outdoor lighting.  Clear any walking paths of anything that might make someone trip, such as rocks or tools.  Regularly check to see if handrails are loose or broken. Make sure that both sides of any steps have handrails.  Any raised decks and porches should have guardrails on the edges.  Have any leaves, snow, or ice cleared regularly.  Use sand or salt on walking paths during winter.  Clean up any spills in your garage right away. This includes oil or grease spills. What can I do in the bathroom?  Use night lights.  Install grab bars by the toilet and in the tub and shower. Do not use towel bars as grab bars.  Use non-skid mats or decals in the tub or shower.  If you need to sit down in the shower, use a plastic, non-slip stool.  Keep the floor dry. Clean up any water that spills on the floor as soon as it happens.  Remove soap buildup in the tub or shower regularly.  Attach bath mats securely with double-sided non-slip rug tape.  Do not have throw rugs and other things on the floor that can make  you trip. What can I do in the bedroom?  Use night lights.  Make sure that you have a light by your bed that is easy to reach.  Do not use any sheets or blankets that are too big for your bed. They should not hang down onto the floor.  Have a firm chair that has side arms. You can use this for support while you get dressed.  Do not have throw rugs and other things on the floor that can make you trip. What can I do in the kitchen?  Clean up any spills right away.  Avoid walking on wet floors.  Keep items that you use a lot in easy-to-reach places.  If you need to reach something above you, use a strong step stool that has a grab bar.  Keep electrical cords out of the way.  Do not use floor polish or wax that makes floors slippery. If you must use wax, use non-skid floor wax.  Do not have throw rugs and other things on the floor that can make you trip. What can I do with my stairs?  Do not leave any items on the stairs.  Make  sure that there are handrails on both sides of the stairs and use them. Fix handrails that are broken or loose. Make sure that handrails are as long as the stairways.  Check any carpeting to make sure that it is firmly attached to the stairs. Fix any carpet that is loose or worn.  Avoid having throw rugs at the top or bottom of the stairs. If you do have throw rugs, attach them to the floor with carpet tape.  Make sure that you have a light switch at the top of the stairs and the bottom of the stairs. If you do not have them, ask someone to add them for you. What else can I do to help prevent falls?  Wear shoes that:  Do not have high heels.  Have rubber bottoms.  Are comfortable and fit you well.  Are closed at the toe. Do not wear sandals.  If you use a stepladder:  Make sure that it is fully opened. Do not climb a closed stepladder.  Make sure that both sides of the stepladder are locked into place.  Ask someone to hold it for you, if  possible.  Clearly mark and make sure that you can see:  Any grab bars or handrails.  First and last steps.  Where the edge of each step is.  Use tools that help you move around (mobility aids) if they are needed. These include:  Canes.  Walkers.  Scooters.  Crutches.  Turn on the lights when you go into a dark area. Replace any light bulbs as soon as they burn out.  Set up your furniture so you have a clear path. Avoid moving your furniture around.  If any of your floors are uneven, fix them.  If there are any pets around you, be aware of where they are.  Review your medicines with your doctor. Some medicines can make you feel dizzy. This can increase your chance of falling. Ask your doctor what other things that you can do to help prevent falls. This information is not intended to replace advice given to you by your health care provider. Make sure you discuss any questions you have with your health care provider. Document Released: 01/25/2009 Document Revised: 09/06/2015 Document Reviewed: 05/05/2014 Elsevier Interactive Patient Education  2017 [employer].

## 2017-10-21 ENCOUNTER — Non-Acute Institutional Stay: Payer: Medicare Other | Admitting: Internal Medicine

## 2017-10-21 ENCOUNTER — Encounter: Payer: Self-pay | Admitting: Internal Medicine

## 2017-10-21 VITALS — BP 112/60 | HR 67 | Temp 98.8°F | Ht 71.0 in | Wt 182.0 lb

## 2017-10-21 DIAGNOSIS — J329 Chronic sinusitis, unspecified: Secondary | ICD-10-CM | POA: Diagnosis not present

## 2017-10-21 DIAGNOSIS — H833X3 Noise effects on inner ear, bilateral: Secondary | ICD-10-CM | POA: Diagnosis not present

## 2017-10-21 DIAGNOSIS — E7800 Pure hypercholesterolemia, unspecified: Secondary | ICD-10-CM

## 2017-10-21 DIAGNOSIS — R4789 Other speech disturbances: Secondary | ICD-10-CM | POA: Diagnosis not present

## 2017-10-21 DIAGNOSIS — N183 Chronic kidney disease, stage 3 unspecified: Secondary | ICD-10-CM

## 2017-10-21 DIAGNOSIS — Z636 Dependent relative needing care at home: Secondary | ICD-10-CM
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
## 2017-10-22 DIAGNOSIS — D519 Vitamin B12 deficiency anemia, unspecified: Secondary | ICD-10-CM | POA: Diagnosis not present

## 2017-10-22 DIAGNOSIS — E559 Vitamin D deficiency, unspecified: Secondary | ICD-10-CM | POA: Diagnosis not present

## 2017-10-22 DIAGNOSIS — D649 Anemia, unspecified: Secondary | ICD-10-CM | POA: Diagnosis not present

## 2017-10-22 DIAGNOSIS — R351 Nocturia: Secondary | ICD-10-CM | POA: Diagnosis not present

## 2017-10-22 LAB — VITAMIN B12: VITAMIN B 12: 437

## 2017-10-22 LAB — PSA: PSA: 3.02

## 2017-10-22 LAB — VITAMIN D 25 HYDROXY (VIT D DEFICIENCY, FRACTURES): Vit D, 25-Hydroxy: 42.44

## 2017-10-23 ENCOUNTER — Encounter: Payer: Self-pay | Admitting: *Deleted

## 2017-10-30 ENCOUNTER — Telehealth: Payer: Self-pay | Admitting: *Deleted

## 2017-10-30 NOTE — Telephone Encounter (Signed)
Per notes written on original lab results fax :  "recommend B12 1000 mcg by mouth daily for low normal B12 level, PSA is 3.02 ( still in normal range), Vitamin D level ok"  Tried calling patient, mailbox is full and can't accept any messages will try again later.

## 2017-11-12 NOTE — Telephone Encounter (Signed)
letter mailed to patients home address with results. Can not leave message

## 2018-01-06 DIAGNOSIS — L821 Other seborrheic keratosis: Secondary | ICD-10-CM | POA: Diagnosis not present

## 2018-01-06 DIAGNOSIS — L814 Other melanin hyperpigmentation: Secondary | ICD-10-CM | POA: Diagnosis not present

## 2018-01-06 DIAGNOSIS — L570 Actinic keratosis: Secondary | ICD-10-CM | POA: Diagnosis not present

## 2018-01-06 DIAGNOSIS — L111 Transient acantholytic dermatosis [Grover]: Secondary | ICD-10-CM | POA: Diagnosis not present

## 2018-01-20 ENCOUNTER — Encounter: Payer: Self-pay | Admitting: Internal Medicine

## 2018-01-20 ENCOUNTER — Non-Acute Institutional Stay: Payer: Medicare Other | Admitting: Internal Medicine

## 2018-01-20 ENCOUNTER — Other Ambulatory Visit: Payer: Self-pay | Admitting: Internal Medicine

## 2018-01-20 VITALS — BP 110/70 | HR 57 | Temp 98.1°F | Ht 71.0 in | Wt 183.0 lb

## 2018-01-20 DIAGNOSIS — F320 Major depressive disorder, single episode, mild: Secondary | ICD-10-CM

## 2018-01-20 DIAGNOSIS — G3184 Mild cognitive impairment, so stated: Secondary | ICD-10-CM | POA: Diagnosis not present

## 2018-01-20 DIAGNOSIS — Z636 Dependent relative needing care at home: Secondary | ICD-10-CM

## 2018-01-20 DIAGNOSIS — Z23 Encounter for immunization: Secondary | ICD-10-CM | POA: Diagnosis not present

## 2018-01-20 MED ORDER — BUPROPION HCL ER (XL) 150 MG PO TB24: 150.0000 mg | ORAL_TABLET | Freq: Every day | ORAL | 1 refills | Status: DC
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
## 2018-01-20 NOTE — Addendum Note (Signed)
Addended by: Hollace Kinnier L on: 01/20/2018 12:14 PM   Modules accepted: Orders

## 2018-02-10 DIAGNOSIS — L738 Other specified follicular disorders: Secondary | ICD-10-CM | POA: Diagnosis not present

## 2018-02-10 DIAGNOSIS — L570 Actinic keratosis: Secondary | ICD-10-CM | POA: Diagnosis not present

## 2018-02-22 DIAGNOSIS — H6121 Impacted cerumen, right ear: Secondary | ICD-10-CM | POA: Diagnosis not present

## 2018-02-24 ENCOUNTER — Telehealth: Payer: Self-pay

## 2018-02-24 NOTE — Telephone Encounter (Signed)
Discussed with patient that no neuropsych evals currently available in [place] at the two neurology offices.  Will begin testing and medication if at his appt with me in December (just made today)

## 2018-02-24 NOTE — Telephone Encounter (Signed)
Patient stopped by the clinic area at [facility]. Patient is requesting a referral to [place], neurologist to follow-up on memory  Referral pending, please advise

## 2018-02-25 ENCOUNTER — Ambulatory Visit (INDEPENDENT_AMBULATORY_CARE_PROVIDER_SITE_OTHER): Payer: Medicare Other | Admitting: Nurse Practitioner

## 2018-02-25 ENCOUNTER — Encounter: Payer: Self-pay | Admitting: Nurse Practitioner

## 2018-02-25 ENCOUNTER — Ambulatory Visit
Admission: RE | Admit: 2018-02-25 | Discharge: 2018-02-25 | Disposition: A | Discharge status: Home / Self Care | Payer: Medicare Other | Source: Ambulatory Visit | Attending: Nurse Practitioner | Admitting: Nurse Practitioner

## 2018-02-25 VITALS — BP 116/72 | HR 70 | Temp 97.8°F | Ht 71.0 in | Wt 184.0 lb

## 2018-02-25 DIAGNOSIS — R413 Other amnesia: Secondary | ICD-10-CM | POA: Diagnosis not present

## 2018-02-25 DIAGNOSIS — F320 Major depressive disorder, single episode, mild: Secondary | ICD-10-CM | POA: Diagnosis not present

## 2018-02-25 DIAGNOSIS — M542 Cervicalgia: Secondary | ICD-10-CM
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
## 2018-02-25 NOTE — Patient Instructions (Signed)
To start Wellbutrin   Will get CT scan and lab work Once this is in will most likely start aricept   PT referral placed To use heating pad ~20 mins twice daily to neck Can use muscle rub after heat Tylenol 500 mg 1-2 tablets every 8 hours as needed pain

## 2018-02-26 ENCOUNTER — Other Ambulatory Visit: Payer: Self-pay | Admitting: Nurse Practitioner

## 2018-02-26 ENCOUNTER — Other Ambulatory Visit: Payer: Self-pay

## 2018-02-26 DIAGNOSIS — R413 Other amnesia: Secondary | ICD-10-CM

## 2018-02-26 LAB — COMPLETE METABOLIC PANEL WITH GFR
AG Ratio: 1.8 (calc) (ref 1.0–2.5)
ALBUMIN MSPROF: 4.2 g/dL (ref 3.6–5.1)
ALKALINE PHOSPHATASE (APISO): 56 U/L (ref 40–115)
ALT: 13 U/L (ref 9–46)
AST: 20 U/L (ref 10–35)
BUN / CREAT RATIO: 16 (calc) (ref 6–22)
BUN: 24 mg/dL (ref 7–25)
CO2: 30 mmol/L (ref 20–32)
CREATININE: 1.47 mg/dL — AB (ref 0.70–1.18)
Calcium: 9.5 mg/dL (ref 8.6–10.3)
Chloride: 102 mmol/L (ref 98–110)
GFR, EST AFRICAN AMERICAN: 52 mL/min/{1.73_m2} — AB (ref >=60)
GFR, Est Non African American: 45 mL/min/{1.73_m2} — ABNORMAL LOW (ref >=60)
GLUCOSE: 88 mg/dL (ref 65–99)
Globulin: 2.3 g/dL (calc) (ref 1.9–3.7)
Potassium: 4.6 mmol/L (ref 3.5–5.3)
Sodium: 138 mmol/L (ref 135–146)
TOTAL PROTEIN: 6.5 g/dL (ref 6.1–8.1)
Total Bilirubin: 1.3 mg/dL — ABNORMAL HIGH (ref 0.2–1.2)

## 2018-02-26 LAB — B12 AND FOLATE PANEL
Folate: 21.6 ng/mL
Vitamin B-12: 466 pg/mL (ref 200–1100)

## 2018-02-26 LAB — CBC WITH DIFFERENTIAL/PLATELET

## 2018-02-26 LAB — RPR: RPR Ser Ql: NONREACTIVE

## 2018-02-26 LAB — TSH: TSH: 10.77 m[IU]/L — AB (ref 0.40–4.50)

## 2018-02-26 MED ORDER — LEVOTHYROXINE SODIUM 25 MCG PO TABS: 25.0000 ug | ORAL_TABLET | Freq: Every day | ORAL | 3 refills | Status: DC

## 2018-02-26 NOTE — Telephone Encounter (Signed)
This encounter was created in error - please disregard.

## 2018-03-01 ENCOUNTER — Other Ambulatory Visit: Payer: Self-pay

## 2018-03-01 ENCOUNTER — Other Ambulatory Visit: Payer: Medicare Other

## 2018-03-01 DIAGNOSIS — R413 Other amnesia: Secondary | ICD-10-CM | POA: Diagnosis not present

## 2018-03-01 LAB — CBC WITH DIFFERENTIAL/PLATELET
BASOS ABS: 99 {cells}/uL (ref 0–200)
Basophils Relative: 1.3 %
EOS ABS: 122 {cells}/uL (ref 15–500)
Eosinophils Relative: 1.6 %
HCT: 40 % (ref 38.5–50.0)
Hemoglobin: 13.6 g/dL (ref 13.2–17.1)
Lymphs Abs: 1239 cells/uL (ref 850–3900)
MCH: 31.6 pg (ref 27.0–33.0)
MCHC: 34 g/dL (ref 32.0–36.0)
MCV: 92.8 fL (ref 80.0–100.0)
MPV: 10.6 fL (ref 7.5–12.5)
Monocytes Relative: 9.6 %
NEUTROS PCT: 71.2 %
Neutro Abs: 5411 cells/uL (ref 1500–7800)
Platelets: 297 10*3/uL (ref 140–400)
RBC: 4.31 10*6/uL (ref 4.20–5.80)
RDW: 12.4 % (ref 11.0–15.0)
Total Lymphocyte: 16.3 %
WBC: 7.6 10*3/uL (ref 3.8–10.8)
WBCMIX: 730 {cells}/uL (ref 200–950)

## 2018-03-10 ENCOUNTER — Other Ambulatory Visit: Payer: Self-pay | Admitting: Internal Medicine

## 2018-03-10 DIAGNOSIS — R413 Other amnesia: Secondary | ICD-10-CM

## 2018-03-10 MED ORDER — DONEPEZIL HCL 5 MG PO TABS: 5.0000 mg | ORAL_TABLET | Freq: Every day | ORAL | 3 refills | Status: DC

## 2018-03-10 NOTE — Progress Notes (Signed)
Pt stopped by the clinic and insists that we have sent in a memory pill for him.  This has not been done up to this point.  He has only bupropion and levothyroxine.  I will now send in aricept 5mg  nightly for him.

## 2018-03-17 ENCOUNTER — Telehealth: Payer: Self-pay | Admitting: *Deleted

## 2018-03-17 NOTE — Telephone Encounter (Signed)
Patient walked in stating that he's being nausea all day, pt has new Aricept and think that is it.  Per verbal with Dr. Mariea Clonts and pt, DO NOT take Aricept tonight but take other meds to see how you're feeling tomorrow.

## 2018-03-19 DIAGNOSIS — H9193 Unspecified hearing loss, bilateral: Secondary | ICD-10-CM | POA: Diagnosis not present

## 2018-03-19 DIAGNOSIS — F0390 Unspecified dementia without behavioral disturbance: Secondary | ICD-10-CM | POA: Diagnosis not present

## 2018-03-19 DIAGNOSIS — R413 Other amnesia: Secondary | ICD-10-CM | POA: Diagnosis not present

## 2018-03-19 DIAGNOSIS — N183 Chronic kidney disease, stage 3 (moderate): Secondary | ICD-10-CM | POA: Diagnosis not present

## 2018-03-19 DIAGNOSIS — E038 Other specified hypothyroidism: Secondary | ICD-10-CM | POA: Diagnosis not present

## 2018-03-19 NOTE — Telephone Encounter (Signed)
Pt had bernadette to call today ( he was in the room on speaker phone) pt states he's been 2 days without Aricept and the nausea is gone, pt would like to try namenda if possible. Please advise

## 2018-03-22 MED ORDER — MEMANTINE HCL ER 7 MG PO CP24: 7.0000 mg | ORAL_CAPSULE | Freq: Every day | ORAL | 0 refills | Status: DC

## 2018-03-22 NOTE — Addendum Note (Signed)
Addended by: Despina Hidden on: 03/22/2018 09:42 AM   Modules accepted: Orders

## 2018-03-22 NOTE — Telephone Encounter (Signed)
Spoke with patient and advised results rx sent to pharmacy by e-script  

## 2018-03-22 NOTE — Telephone Encounter (Signed)
Ok, let's send in the namenda XR titration pack if it's available.  If not, we'll start him on just 7mg  namenda XR daily until I see him next.

## 2018-03-24 ENCOUNTER — Encounter: Payer: Self-pay | Admitting: Internal Medicine

## 2018-04-24 ENCOUNTER — Other Ambulatory Visit: Payer: Self-pay | Admitting: Internal Medicine

## 2018-04-28 ENCOUNTER — Encounter: Payer: Medicare Other | Admitting: Internal Medicine

## 2018-04-30 ENCOUNTER — Other Ambulatory Visit: Payer: Self-pay | Admitting: Internal Medicine

## 2018-05-05 DIAGNOSIS — L570 Actinic keratosis: Secondary | ICD-10-CM | POA: Diagnosis not present

## 2018-05-05 DIAGNOSIS — L738 Other specified follicular disorders: Secondary | ICD-10-CM | POA: Diagnosis not present

## 2018-08-03 ENCOUNTER — Other Ambulatory Visit: Payer: Self-pay | Admitting: Family Medicine

## 2018-08-03 ENCOUNTER — Ambulatory Visit
Admission: RE | Admit: 2018-08-03 | Discharge: 2018-08-03 | Disposition: A | Discharge status: Home / Self Care | Payer: Medicare Other | Source: Ambulatory Visit | Attending: Family Medicine | Admitting: Family Medicine

## 2018-08-03 ENCOUNTER — Other Ambulatory Visit: Payer: Self-pay

## 2018-08-03 DIAGNOSIS — R52 Pain, unspecified: Secondary | ICD-10-CM

## 2018-08-08 ENCOUNTER — Encounter: Payer: Self-pay | Admitting: Internal Medicine

## 2019-09-05 ENCOUNTER — Encounter: Payer: Self-pay | Admitting: Neurology
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
## 2019-10-24 ENCOUNTER — Encounter: Payer: Self-pay | Admitting: Neurology

## 2019-10-24 ENCOUNTER — Other Ambulatory Visit: Payer: Self-pay | Admitting: Neurology

## 2019-10-24 ENCOUNTER — Ambulatory Visit: Payer: Medicare Other | Admitting: Neurology

## 2019-10-24 ENCOUNTER — Other Ambulatory Visit: Payer: Self-pay

## 2019-10-24 VITALS — BP 162/77 | HR 66 | Ht 73.0 in | Wt 184.0 lb

## 2019-10-24 DIAGNOSIS — G309 Alzheimer's disease, unspecified: Secondary | ICD-10-CM | POA: Diagnosis not present

## 2019-10-24 DIAGNOSIS — M5441 Lumbago with sciatica, right side: Secondary | ICD-10-CM | POA: Diagnosis not present

## 2019-10-24 DIAGNOSIS — G8929 Other chronic pain: Secondary | ICD-10-CM

## 2019-10-24 DIAGNOSIS — M5442 Lumbago with sciatica, left side: Secondary | ICD-10-CM

## 2019-10-24 DIAGNOSIS — F0280 Dementia in other diseases classified elsewhere without behavioral disturbance: Secondary | ICD-10-CM

## 2019-10-24 MED ORDER — DONEPEZIL HCL 5 MG PO TABS: 5.0000 mg | ORAL_TABLET | Freq: Every day | ORAL | 0 refills | Status: DC

## 2019-10-24 NOTE — Patient Instructions (Addendum)
1.  Will refer you to a occupational driving evaluator.  No driving until you are evaluated. 2.  Will send you to physical therapy to help with back pain and walking. 3.  Follow up in 6 months

## 2019-10-27 ENCOUNTER — Telehealth: Payer: Self-pay | Admitting: Neurology

## 2019-10-27 NOTE — Telephone Encounter (Signed)
Left message on VM he needs to come by and pick up another copy of the paper work for the driving it got misplaced  Please call patient

## 2019-10-28 NOTE — Telephone Encounter (Signed)
left VM requesting call back re driving evaluation question

## 2019-11-01 ENCOUNTER — Ambulatory Visit: Payer: Medicare Other | Admitting: Physical Therapy

## 2019-11-01 NOTE — Telephone Encounter (Signed)
Gustavus Messing, friend to patient and on DPR called yesterday and spoke to Elspeth Cho, LPN. She has asked that I call regarding patients most recent appointment with Dr. Tomi Likens. She has a medical concern that she would like to address regarding the visit with Dr.Jaffe. I called [phone] today at 8:29am and left a voice mail message for her to return my call.

## 2019-11-30 ENCOUNTER — Ambulatory Visit: Payer: Medicare Other | Admitting: Neurology

## 2019-12-07 ENCOUNTER — Telehealth: Payer: Self-pay | Admitting: Neurology

## 2019-12-07 NOTE — Telephone Encounter (Signed)
Patient was seen on 11/30/19 for Occupational Therapy with [facility].
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
## 2020-01-12 ENCOUNTER — Ambulatory Visit (INDEPENDENT_AMBULATORY_CARE_PROVIDER_SITE_OTHER): Payer: Medicare Other | Admitting: Neurology

## 2020-01-12 ENCOUNTER — Encounter: Payer: Self-pay | Admitting: Neurology

## 2020-01-12 VITALS — BP 145/74 | HR 59 | Ht 71.0 in | Wt 191.0 lb

## 2020-01-12 DIAGNOSIS — F0280 Dementia in other diseases classified elsewhere without behavioral disturbance: Secondary | ICD-10-CM

## 2020-01-12 DIAGNOSIS — G301 Alzheimer's disease with late onset: Secondary | ICD-10-CM

## 2020-01-12 NOTE — Patient Instructions (Addendum)
Happy to provide more testing. Alfred Clark can discuss with his son and his son is more than welcome to reach out to me to discuss. Options include MRI of the brain, formal neurocognitive testing(if not already completed - we can request), FDG PET Scan which is brain imaging that can differentiate between different types of dementia.However if he had formal extensive memory testing as described and has been diagnosed then more testing may not be necessary. Continue current medications.   Dementia Dementia is a condition that affects the way the brain functions. It often affects memory and thinking. Usually, dementia gets worse with time and cannot be reversed (progressive dementia). There are many types of dementia, including:  Alzheimer's disease. This type is the most common.  Vascular dementia. This type may happen as the result of a stroke.  Lewy body dementia. This type may happen to people who have Parkinson's disease.  Frontotemporal dementia. This type is caused by damage to nerve cells (neurons) in certain parts of the brain. Some people may be affected by more than one type of dementia. This is called mixed dementia. What are the causes? Dementia is caused by damage to cells in the brain. The area of the brain and the types of cells damaged determine the type of dementia. Usually, this damage is irreversible or cannot be undone. Some examples of irreversible causes include:  Conditions that affect the blood vessels of the brain, such as diabetes, heart disease, or blood vessel disease.  Genetic mutations. In some cases, changes in the brain may be caused by another condition and can be reversed or slowed. Some examples of reversible causes include:  Injury to the brain.  Certain medicines.  Infection, such as meningitis.  Metabolic problems, such as vitamin B12 deficiency or thyroid disease.  Pressure on the brain, such as from a tumor or blood clot. What are the signs or  symptoms? Symptoms of dementia depend on the type of dementia. Common signs of dementia include problems with remembering, thinking, problem solving, decision making, and communicating. These signs develop slowly or get worse with time. This may include:  Problems remembering things.  Having trouble taking a bath or putting clothes on.  Forgetting appointments.  Forgetting to pay bills.  Difficulty planning and preparing meals.  Having trouble speaking.  Getting lost easily. How is this diagnosed? This condition is diagnosed by a specialist (neurologist). It is diagnosed based on the history of your symptoms, your medical history, a physical exam, and tests. Tests may include:  Tests to evaluate brain function, such as memory tests, cognitive tests, and other tests.  Lab tests, such as blood or urine tests.  Imaging tests, such as a CT scan, a PET scan, or an MRI.  Genetic testing. This may be done if other family members have a diagnosis of certain types of dementia. Your health care provider will talk with you and your family, friends, or caregivers about your history and symptoms. How is this treated?  Treatment for this condition depends on the cause of the dementia. Progressive dementias, such as Alzheimer's disease, cannot be cured, but there may be treatments that help to manage symptoms. Treatment might involve taking medicines that may help to:  Control the dementia.  Slow down the progression of the dementia.  Manage symptoms. In some cases, treating the cause of your dementia can improve symptoms, reverse symptoms, or slow down how quickly your dementia becomes worse. Your health care provider can direct you to support groups,  organizations, and other health care providers who can help with decisions about your care. Follow these instructions at home: Medicines  Take over-the-counter and prescription medicines only as told by your health care provider.  Use a  pill organizer or pill reminder to help you manage your medicines.  Avoid taking medicines that can affect thinking, such as pain medicines or sleeping medicines. Lifestyle  Make healthy lifestyle choices. ? Be physically active as told by your health care provider. ? Do not use any products that contain nicotine or tobacco, such as cigarettes, e-cigarettes, and chewing tobacco. If you need help quitting, ask your health care provider. ? Do not drink alcohol. ? Practice stress-management techniques when you get stressed. ? Spend time with other people.  Make sure to get quality sleep. These tips can help you get a good night's rest: ? Avoid napping during the day. ? Keep your sleeping area dark and cool. ? Avoid exercising during the few hours before you go to bed. ? Avoid caffeine products in the evening. Eating and drinking  Drink enough fluid to keep your urine pale yellow.  Eat a healthy diet. General instructions   Work with your health care provider to determine what you need help with and what your safety needs are.  Talk with your health care provider about whether it is safe for you to drive.  If you were given a bracelet that identifies you as a person with memory loss or tracks your location, make sure to wear it at all times.  Work with your family to make important decisions, such as advance directives, medical power of attorney, or a living will.  Keep all follow-up visits as told by your health care provider. This is important. Where to find more information  Alzheimer's Association: CapitalMile.co.nz  National Institute on Aging: DVDEnthusiasts.nl  World Health Organization: RoleLink.com.br Contact a health care provider if:  You have any new or worsening symptoms.  You have problems with choking or swallowing. Get help right away if:  You feel depressed or sad, or feel that you want to harm yourself.  Your family members become concerned for your  safety. If you ever feel like you may hurt yourself or others, or have thoughts about taking your own life, get help right away. You can go to your nearest emergency department or call:  Your local emergency services (911 in the [place]  A suicide crisis helpline, such as the [place] at 7605629841. This is open 24 hours a day. Summary  Dementia is a condition that affects the way the brain functions. Dementia often affects memory and thinking.  Usually, dementia gets worse with time and cannot be reversed (progressive dementia).  Treatment for this condition depends on the cause of the dementia.  Work with your health care provider to determine what you need help with and what your safety needs are.  Your health care provider can direct you to support groups, organizations, and other health care providers who can help with decisions about your care. This information is not intended to replace advice given to you by your health care provider. Make sure you discuss any questions you have with your health care provider. Document Revised: 06/15/2018 Document Reviewed: 06/15/2018 Elsevier Alfred [employer] Education  [place] history and physical demonstrated very substantial and measurable cognitive losses consistent with dementia. Based on the prior experiences in the community in the substantial degree of impairment is clear that Alfred Clark does not have  the capacity to make an informed and appropriate decisions on her healthcare and finances. I do recommend that Alfred Clark lives in a structured setting. This also clear that Alfred Clark does not comprehend the degree of cognitive losses were the risks that Alfred Clark has been in with her falls and own self-neglect within the home. On this basis I feel it is necessary to point a formal guardian of Alfred Clark's person and financial affairs and someone who can continue to look out for the best interests of Alfred Clark who is suffering from  substantial cognitive impairment due to dementia.

## 2020-01-16 ENCOUNTER — Ambulatory Visit: Payer: Medicare Other | Admitting: Neurology

## 2020-04-25 ENCOUNTER — Ambulatory Visit: Payer: Medicare Other | Admitting: Neurology

## 2020-04-26 DIAGNOSIS — E039 Hypothyroidism, unspecified: Secondary | ICD-10-CM | POA: Diagnosis not present

## 2020-04-26 DIAGNOSIS — H409 Unspecified glaucoma: Secondary | ICD-10-CM | POA: Diagnosis not present

## 2020-04-26 DIAGNOSIS — N1832 Chronic kidney disease, stage 3b: Secondary | ICD-10-CM | POA: Diagnosis not present

## 2020-04-26 DIAGNOSIS — M47812 Spondylosis without myelopathy or radiculopathy, cervical region: Secondary | ICD-10-CM | POA: Diagnosis not present

## 2020-05-01 DIAGNOSIS — E039 Hypothyroidism, unspecified: Secondary | ICD-10-CM | POA: Diagnosis not present

## 2020-05-01 DIAGNOSIS — H409 Unspecified glaucoma: Secondary | ICD-10-CM | POA: Diagnosis not present

## 2020-05-01 DIAGNOSIS — N1832 Chronic kidney disease, stage 3b: Secondary | ICD-10-CM | POA: Diagnosis not present

## 2020-05-01 DIAGNOSIS — M47812 Spondylosis without myelopathy or radiculopathy, cervical region: Secondary | ICD-10-CM | POA: Diagnosis not present

## 2020-05-23 DIAGNOSIS — M1811 Unilateral primary osteoarthritis of first carpometacarpal joint, right hand: Secondary | ICD-10-CM | POA: Diagnosis not present

## 2020-06-05 DIAGNOSIS — N1832 Chronic kidney disease, stage 3b: Secondary | ICD-10-CM | POA: Diagnosis not present

## 2020-06-05 DIAGNOSIS — H409 Unspecified glaucoma: Secondary | ICD-10-CM | POA: Diagnosis not present

## 2020-06-05 DIAGNOSIS — E039 Hypothyroidism, unspecified: Secondary | ICD-10-CM | POA: Diagnosis not present

## 2020-06-05 DIAGNOSIS — M47812 Spondylosis without myelopathy or radiculopathy, cervical region: Secondary | ICD-10-CM | POA: Diagnosis not present

## 2020-06-25 DIAGNOSIS — E039 Hypothyroidism, unspecified: Secondary | ICD-10-CM | POA: Diagnosis not present

## 2020-06-25 DIAGNOSIS — H409 Unspecified glaucoma: Secondary | ICD-10-CM | POA: Diagnosis not present

## 2020-06-25 DIAGNOSIS — M47812 Spondylosis without myelopathy or radiculopathy, cervical region: Secondary | ICD-10-CM | POA: Diagnosis not present

## 2020-06-25 DIAGNOSIS — N1832 Chronic kidney disease, stage 3b: Secondary | ICD-10-CM | POA: Diagnosis not present

## 2020-07-12 ENCOUNTER — Ambulatory Visit: Payer: Medicare Other | Admitting: Neurology

## 2020-07-17 DIAGNOSIS — H5203 Hypermetropia, bilateral: Secondary | ICD-10-CM | POA: Diagnosis not present

## 2020-07-17 DIAGNOSIS — H401131 Primary open-angle glaucoma, bilateral, mild stage: Secondary | ICD-10-CM | POA: Diagnosis not present

## 2020-07-17 DIAGNOSIS — Z961 Presence of intraocular lens: Secondary | ICD-10-CM | POA: Diagnosis not present

## 2020-08-06 DIAGNOSIS — H6121 Impacted cerumen, right ear: Secondary | ICD-10-CM | POA: Diagnosis not present

## 2020-08-10 DIAGNOSIS — M47812 Spondylosis without myelopathy or radiculopathy, cervical region: Secondary | ICD-10-CM | POA: Diagnosis not present

## 2020-08-10 DIAGNOSIS — E039 Hypothyroidism, unspecified: Secondary | ICD-10-CM | POA: Diagnosis not present

## 2020-08-10 DIAGNOSIS — N1832 Chronic kidney disease, stage 3b: Secondary | ICD-10-CM | POA: Diagnosis not present

## 2020-08-10 DIAGNOSIS — H409 Unspecified glaucoma: Secondary | ICD-10-CM | POA: Diagnosis not present

## 2020-08-17 DIAGNOSIS — L570 Actinic keratosis: Secondary | ICD-10-CM | POA: Diagnosis not present

## 2020-08-28 DIAGNOSIS — M25511 Pain in right shoulder: Secondary | ICD-10-CM | POA: Diagnosis not present

## 2020-08-28 DIAGNOSIS — M9907 Segmental and somatic dysfunction of upper extremity: Secondary | ICD-10-CM | POA: Diagnosis not present

## 2020-09-04 DIAGNOSIS — H409 Unspecified glaucoma: Secondary | ICD-10-CM | POA: Diagnosis not present

## 2020-09-04 DIAGNOSIS — N1832 Chronic kidney disease, stage 3b: Secondary | ICD-10-CM | POA: Diagnosis not present

## 2020-09-04 DIAGNOSIS — M47812 Spondylosis without myelopathy or radiculopathy, cervical region: Secondary | ICD-10-CM | POA: Diagnosis not present

## 2020-09-04 DIAGNOSIS — E039 Hypothyroidism, unspecified: Secondary | ICD-10-CM | POA: Diagnosis not present

## 2020-09-14 DIAGNOSIS — L570 Actinic keratosis: Secondary | ICD-10-CM | POA: Diagnosis not present

## 2020-09-21 DIAGNOSIS — M25511 Pain in right shoulder: Secondary | ICD-10-CM | POA: Diagnosis not present

## 2020-09-27 DIAGNOSIS — M75101 Unspecified rotator cuff tear or rupture of right shoulder, not specified as traumatic: Secondary | ICD-10-CM | POA: Diagnosis not present

## 2020-09-27 DIAGNOSIS — M19011 Primary osteoarthritis, right shoulder: Secondary | ICD-10-CM | POA: Diagnosis not present

## 2020-10-11 DIAGNOSIS — E039 Hypothyroidism, unspecified: Secondary | ICD-10-CM | POA: Diagnosis not present

## 2020-10-11 DIAGNOSIS — N1832 Chronic kidney disease, stage 3b: Secondary | ICD-10-CM | POA: Diagnosis not present

## 2020-10-11 DIAGNOSIS — H409 Unspecified glaucoma: Secondary | ICD-10-CM | POA: Diagnosis not present

## 2020-10-11 DIAGNOSIS — M47812 Spondylosis without myelopathy or radiculopathy, cervical region: Secondary | ICD-10-CM | POA: Diagnosis not present

## 2020-10-30 DIAGNOSIS — N1832 Chronic kidney disease, stage 3b: Secondary | ICD-10-CM | POA: Diagnosis not present

## 2020-11-08 DIAGNOSIS — E039 Hypothyroidism, unspecified: Secondary | ICD-10-CM | POA: Diagnosis not present

## 2020-11-08 DIAGNOSIS — H409 Unspecified glaucoma: Secondary | ICD-10-CM | POA: Diagnosis not present

## 2020-11-08 DIAGNOSIS — M47812 Spondylosis without myelopathy or radiculopathy, cervical region: Secondary | ICD-10-CM | POA: Diagnosis not present

## 2020-11-08 DIAGNOSIS — N1832 Chronic kidney disease, stage 3b: Secondary | ICD-10-CM | POA: Diagnosis not present

## 2020-11-26 DIAGNOSIS — H409 Unspecified glaucoma: Secondary | ICD-10-CM | POA: Diagnosis not present

## 2020-11-26 DIAGNOSIS — M47812 Spondylosis without myelopathy or radiculopathy, cervical region: Secondary | ICD-10-CM | POA: Diagnosis not present

## 2020-11-26 DIAGNOSIS — E039 Hypothyroidism, unspecified: Secondary | ICD-10-CM | POA: Diagnosis not present

## 2020-11-26 DIAGNOSIS — N1832 Chronic kidney disease, stage 3b: Secondary | ICD-10-CM | POA: Diagnosis not present

## 2021-01-17 DIAGNOSIS — H43813 Vitreous degeneration, bilateral: Secondary | ICD-10-CM | POA: Diagnosis not present

## 2021-01-17 DIAGNOSIS — H35371 Puckering of macula, right eye: Secondary | ICD-10-CM | POA: Diagnosis not present

## 2021-01-17 DIAGNOSIS — H33001 Unspecified retinal detachment with retinal break, right eye: Secondary | ICD-10-CM | POA: Diagnosis not present

## 2021-01-17 DIAGNOSIS — H43393 Other vitreous opacities, bilateral: Secondary | ICD-10-CM | POA: Diagnosis not present

## 2021-01-17 DIAGNOSIS — H33021 Retinal detachment with multiple breaks, right eye: Secondary | ICD-10-CM | POA: Diagnosis not present

## 2021-01-17 DIAGNOSIS — H31092 Other chorioretinal scars, left eye: Secondary | ICD-10-CM | POA: Diagnosis not present

## 2021-01-18 DIAGNOSIS — U071 COVID-19: Secondary | ICD-10-CM | POA: Diagnosis not present

## 2021-01-18 DIAGNOSIS — R053 Chronic cough: Secondary | ICD-10-CM | POA: Diagnosis not present

## 2021-02-25 DIAGNOSIS — M47812 Spondylosis without myelopathy or radiculopathy, cervical region: Secondary | ICD-10-CM | POA: Diagnosis not present

## 2021-02-25 DIAGNOSIS — H409 Unspecified glaucoma: Secondary | ICD-10-CM | POA: Diagnosis not present

## 2021-02-25 DIAGNOSIS — N1832 Chronic kidney disease, stage 3b: Secondary | ICD-10-CM | POA: Diagnosis not present

## 2021-02-25 DIAGNOSIS — E039 Hypothyroidism, unspecified: Secondary | ICD-10-CM | POA: Diagnosis not present

## 2021-02-27 DIAGNOSIS — H3341 Traction detachment of retina, right eye: Secondary | ICD-10-CM | POA: Diagnosis not present

## 2021-02-27 DIAGNOSIS — H33011 Retinal detachment with single break, right eye: Secondary | ICD-10-CM | POA: Diagnosis not present

## 2021-03-11 DIAGNOSIS — H33021 Retinal detachment with multiple breaks, right eye: Secondary | ICD-10-CM | POA: Diagnosis not present

## 2021-03-11 DIAGNOSIS — H43812 Vitreous degeneration, left eye: Secondary | ICD-10-CM | POA: Diagnosis not present

## 2021-03-11 DIAGNOSIS — H409 Unspecified glaucoma: Secondary | ICD-10-CM | POA: Diagnosis not present

## 2021-03-11 DIAGNOSIS — Z23 Encounter for immunization: Secondary | ICD-10-CM | POA: Diagnosis not present

## 2021-03-11 DIAGNOSIS — H35373 Puckering of macula, bilateral: Secondary | ICD-10-CM | POA: Diagnosis not present

## 2021-03-11 DIAGNOSIS — H33311 Horseshoe tear of retina without detachment, right eye: Secondary | ICD-10-CM | POA: Diagnosis not present

## 2021-03-11 DIAGNOSIS — H9193 Unspecified hearing loss, bilateral: Secondary | ICD-10-CM | POA: Diagnosis not present

## 2021-03-11 DIAGNOSIS — E039 Hypothyroidism, unspecified: Secondary | ICD-10-CM | POA: Diagnosis not present

## 2021-03-11 DIAGNOSIS — N1832 Chronic kidney disease, stage 3b: Secondary | ICD-10-CM | POA: Diagnosis not present

## 2021-03-18 DIAGNOSIS — L570 Actinic keratosis: Secondary | ICD-10-CM | POA: Diagnosis not present

## 2021-03-18 DIAGNOSIS — D1801 Hemangioma of skin and subcutaneous tissue: Secondary | ICD-10-CM | POA: Diagnosis not present

## 2021-03-18 DIAGNOSIS — L814 Other melanin hyperpigmentation: Secondary | ICD-10-CM | POA: Diagnosis not present

## 2021-04-04 DIAGNOSIS — H59811 Chorioretinal scars after surgery for detachment, right eye: Secondary | ICD-10-CM | POA: Diagnosis not present

## 2021-04-04 DIAGNOSIS — H35371 Puckering of macula, right eye: Secondary | ICD-10-CM | POA: Diagnosis not present

## 2021-04-11 DIAGNOSIS — H401131 Primary open-angle glaucoma, bilateral, mild stage: Secondary | ICD-10-CM | POA: Diagnosis not present

## 2021-04-11 DIAGNOSIS — H5202 Hypermetropia, left eye: Secondary | ICD-10-CM | POA: Diagnosis not present

## 2021-04-11 DIAGNOSIS — H5211 Myopia, right eye: Secondary | ICD-10-CM | POA: Diagnosis not present

## 2021-04-23 ENCOUNTER — Other Ambulatory Visit (HOSPITAL_BASED_OUTPATIENT_CLINIC_OR_DEPARTMENT_OTHER): Payer: Self-pay | Admitting: Family Medicine

## 2021-04-23 ENCOUNTER — Other Ambulatory Visit: Payer: Self-pay

## 2021-04-23 ENCOUNTER — Ambulatory Visit (HOSPITAL_BASED_OUTPATIENT_CLINIC_OR_DEPARTMENT_OTHER)
Admission: RE | Admit: 2021-04-23 | Discharge: 2021-04-23 | Disposition: A | Discharge status: Home / Self Care | Payer: Medicare Other | Source: Ambulatory Visit | Attending: Family Medicine | Admitting: Family Medicine

## 2021-04-23 DIAGNOSIS — J209 Acute bronchitis, unspecified: Secondary | ICD-10-CM | POA: Insufficient documentation

## 2021-04-23 DIAGNOSIS — J069 Acute upper respiratory infection, unspecified: Secondary | ICD-10-CM | POA: Diagnosis not present

## 2021-04-26 DIAGNOSIS — H33311 Horseshoe tear of retina without detachment, right eye: Secondary | ICD-10-CM | POA: Diagnosis not present

## 2021-04-26 DIAGNOSIS — Z20822 Contact with and (suspected) exposure to covid-19: Secondary | ICD-10-CM | POA: Diagnosis not present

## 2021-04-26 DIAGNOSIS — N1832 Chronic kidney disease, stage 3b: Secondary | ICD-10-CM | POA: Diagnosis not present

## 2021-04-26 DIAGNOSIS — R0989 Other specified symptoms and signs involving the circulatory and respiratory systems: Secondary | ICD-10-CM | POA: Diagnosis not present

## 2021-04-26 DIAGNOSIS — H9193 Unspecified hearing loss, bilateral: Secondary | ICD-10-CM | POA: Diagnosis not present

## 2021-04-26 DIAGNOSIS — E039 Hypothyroidism, unspecified: Secondary | ICD-10-CM | POA: Diagnosis not present

## 2021-04-26 DIAGNOSIS — R051 Acute cough: Secondary | ICD-10-CM | POA: Diagnosis not present

## 2021-05-09 DIAGNOSIS — H401131 Primary open-angle glaucoma, bilateral, mild stage: Secondary | ICD-10-CM | POA: Diagnosis not present

## 2021-06-06 DIAGNOSIS — Z961 Presence of intraocular lens: Secondary | ICD-10-CM | POA: Diagnosis not present

## 2021-06-06 DIAGNOSIS — H401131 Primary open-angle glaucoma, bilateral, mild stage: Secondary | ICD-10-CM | POA: Diagnosis not present

## 2021-07-04 DIAGNOSIS — H43813 Vitreous degeneration, bilateral: Secondary | ICD-10-CM | POA: Diagnosis not present

## 2021-07-04 DIAGNOSIS — H35371 Puckering of macula, right eye: Secondary | ICD-10-CM | POA: Diagnosis not present

## 2021-07-04 DIAGNOSIS — H43393 Other vitreous opacities, bilateral: Secondary | ICD-10-CM | POA: Diagnosis not present

## 2021-07-04 DIAGNOSIS — H35362 Drusen (degenerative) of macula, left eye: Secondary | ICD-10-CM | POA: Diagnosis not present

## 2021-07-17 DIAGNOSIS — L218 Other seborrheic dermatitis: Secondary | ICD-10-CM | POA: Diagnosis not present

## 2021-07-17 DIAGNOSIS — L820 Inflamed seborrheic keratosis: Secondary | ICD-10-CM | POA: Diagnosis not present

## 2021-07-17 DIAGNOSIS — L570 Actinic keratosis: Secondary | ICD-10-CM | POA: Diagnosis not present

## 2021-09-17 DIAGNOSIS — N1832 Chronic kidney disease, stage 3b: Secondary | ICD-10-CM | POA: Diagnosis not present

## 2021-09-17 DIAGNOSIS — H409 Unspecified glaucoma: Secondary | ICD-10-CM | POA: Diagnosis not present

## 2021-09-17 DIAGNOSIS — H9193 Unspecified hearing loss, bilateral: Secondary | ICD-10-CM | POA: Diagnosis not present

## 2021-09-17 DIAGNOSIS — H33311 Horseshoe tear of retina without detachment, right eye: Secondary | ICD-10-CM | POA: Diagnosis not present

## 2021-09-17 DIAGNOSIS — E039 Hypothyroidism, unspecified: Secondary | ICD-10-CM | POA: Diagnosis not present

## 2021-09-30 DIAGNOSIS — L309 Dermatitis, unspecified: Secondary | ICD-10-CM | POA: Diagnosis not present

## 2021-10-08 DIAGNOSIS — H401131 Primary open-angle glaucoma, bilateral, mild stage: Secondary | ICD-10-CM | POA: Diagnosis not present

## 2021-10-08 DIAGNOSIS — H33001 Unspecified retinal detachment with retinal break, right eye: Secondary | ICD-10-CM | POA: Diagnosis not present

## 2021-10-31 DIAGNOSIS — M25511 Pain in right shoulder: Secondary | ICD-10-CM | POA: Diagnosis not present

## 2021-10-31 DIAGNOSIS — M19011 Primary osteoarthritis, right shoulder: Secondary | ICD-10-CM | POA: Diagnosis not present

## 2021-10-31 DIAGNOSIS — M75101 Unspecified rotator cuff tear or rupture of right shoulder, not specified as traumatic: Secondary | ICD-10-CM | POA: Diagnosis not present

## 2022-01-14 DIAGNOSIS — H401131 Primary open-angle glaucoma, bilateral, mild stage: Secondary | ICD-10-CM | POA: Diagnosis not present

## 2022-01-14 DIAGNOSIS — H02831 Dermatochalasis of right upper eyelid: Secondary | ICD-10-CM | POA: Diagnosis not present

## 2022-01-14 DIAGNOSIS — H02834 Dermatochalasis of left upper eyelid: Secondary | ICD-10-CM | POA: Diagnosis not present

## 2022-01-14 DIAGNOSIS — H0100A Unspecified blepharitis right eye, upper and lower eyelids: Secondary | ICD-10-CM | POA: Diagnosis not present

## 2022-01-14 DIAGNOSIS — H0100B Unspecified blepharitis left eye, upper and lower eyelids: Secondary | ICD-10-CM | POA: Diagnosis not present

## 2022-01-14 DIAGNOSIS — H04213 Epiphora due to excess lacrimation, bilateral lacrimal glands: Secondary | ICD-10-CM | POA: Diagnosis not present

## 2022-01-16 DIAGNOSIS — L570 Actinic keratosis: Secondary | ICD-10-CM | POA: Diagnosis not present

## 2022-01-16 DIAGNOSIS — L309 Dermatitis, unspecified: Secondary | ICD-10-CM | POA: Diagnosis not present

## 2022-01-16 DIAGNOSIS — L738 Other specified follicular disorders: Secondary | ICD-10-CM | POA: Diagnosis not present

## 2022-01-23 ENCOUNTER — Other Ambulatory Visit: Payer: Self-pay

## 2022-01-23 ENCOUNTER — Encounter (HOSPITAL_BASED_OUTPATIENT_CLINIC_OR_DEPARTMENT_OTHER): Payer: Self-pay

## 2022-01-23 ENCOUNTER — Emergency Department (HOSPITAL_BASED_OUTPATIENT_CLINIC_OR_DEPARTMENT_OTHER): Payer: Medicare Other

## 2022-01-23 DIAGNOSIS — Z5321 Procedure and treatment not carried out due to patient leaving prior to being seen by health care provider: Secondary | ICD-10-CM | POA: Diagnosis not present

## 2022-01-23 DIAGNOSIS — R4182 Altered mental status, unspecified: Secondary | ICD-10-CM | POA: Insufficient documentation

## 2022-01-23 LAB — CBC
HCT: 37.6 % — ABNORMAL LOW (ref 39.0–52.0)
Hemoglobin: 12.4 g/dL — ABNORMAL LOW (ref 13.0–17.0)
MCH: 32.5 pg (ref 26.0–34.0)
MCHC: 33 g/dL (ref 30.0–36.0)
MCV: 98.4 fL (ref 80.0–100.0)
Platelets: 344 10*3/uL (ref 150–400)
RBC: 3.82 MIL/uL — ABNORMAL LOW (ref 4.22–5.81)
RDW: 15.1 % (ref 11.5–15.5)
WBC: 8.8 10*3/uL (ref 4.0–10.5)
nRBC: 0.2 % (ref 0.0–0.2)

## 2022-01-23 LAB — COMPREHENSIVE METABOLIC PANEL
ALT: 9 U/L (ref 0–44)
AST: 16 U/L (ref 15–41)
Albumin: 4.1 g/dL (ref 3.5–5.0)
Alkaline Phosphatase: 58 U/L (ref 38–126)
Anion gap: 11 (ref 5–15)
BUN: 26 mg/dL — ABNORMAL HIGH (ref 8–23)
CO2: 23 mmol/L (ref 22–32)
Calcium: 9.2 mg/dL (ref 8.9–10.3)
Chloride: 103 mmol/L (ref 98–111)
Creatinine, Ser: 2 mg/dL — ABNORMAL HIGH (ref 0.61–1.24)
GFR, Estimated: 33 mL/min — ABNORMAL LOW (ref >60)
Glucose, Bld: 109 mg/dL — ABNORMAL HIGH (ref 70–99)
Potassium: 4.3 mmol/L (ref 3.5–5.1)
Sodium: 137 mmol/L (ref 135–145)
Total Bilirubin: 0.7 mg/dL (ref 0.3–1.2)
Total Protein: 6.8 g/dL (ref 6.5–8.1)

## 2022-01-23 LAB — CBG MONITORING, ED: Glucose-Capillary: 106 mg/dL — ABNORMAL HIGH (ref 70–99)
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
## 2022-01-24 ENCOUNTER — Emergency Department (HOSPITAL_BASED_OUTPATIENT_CLINIC_OR_DEPARTMENT_OTHER)
Admission: EM | Admit: 2022-01-24 | Discharge: 2022-01-24 | Discharge status: Against Medical Advice | Payer: Medicare Other | Attending: Emergency Medicine | Admitting: Emergency Medicine

## 2022-01-29 ENCOUNTER — Ambulatory Visit
Admission: RE | Admit: 2022-01-29 | Discharge: 2022-01-29 | Disposition: A | Discharge status: Home / Self Care | Payer: Medicare Other | Source: Ambulatory Visit | Attending: Family Medicine | Admitting: Family Medicine

## 2022-01-29 ENCOUNTER — Other Ambulatory Visit: Payer: Self-pay | Admitting: Family Medicine

## 2022-01-29 DIAGNOSIS — M25551 Pain in right hip: Secondary | ICD-10-CM

## 2022-01-29 DIAGNOSIS — R413 Other amnesia: Secondary | ICD-10-CM | POA: Diagnosis not present

## 2022-01-29 DIAGNOSIS — Z23 Encounter for immunization: Secondary | ICD-10-CM | POA: Diagnosis not present

## 2022-01-29 DIAGNOSIS — N179 Acute kidney failure, unspecified: Secondary | ICD-10-CM | POA: Diagnosis not present

## 2022-03-12 ENCOUNTER — Institutional Professional Consult (permissible substitution): Payer: Medicare Other | Admitting: Neurology

## 2022-03-20 DIAGNOSIS — M25551 Pain in right hip: Secondary | ICD-10-CM | POA: Diagnosis not present

## 2022-03-28 DIAGNOSIS — N1832 Chronic kidney disease, stage 3b: Secondary | ICD-10-CM | POA: Diagnosis not present

## 2022-03-28 DIAGNOSIS — L299 Pruritus, unspecified: Secondary | ICD-10-CM | POA: Diagnosis not present

## 2022-03-28 DIAGNOSIS — E039 Hypothyroidism, unspecified: Secondary | ICD-10-CM | POA: Diagnosis not present

## 2022-03-28 DIAGNOSIS — D649 Anemia, unspecified: Secondary | ICD-10-CM | POA: Diagnosis not present

## 2022-04-26 DIAGNOSIS — R269 Unspecified abnormalities of gait and mobility: Secondary | ICD-10-CM | POA: Diagnosis not present

## 2022-04-26 DIAGNOSIS — M25551 Pain in right hip: Secondary | ICD-10-CM | POA: Diagnosis not present

## 2022-05-01 DIAGNOSIS — R269 Unspecified abnormalities of gait and mobility: Secondary | ICD-10-CM | POA: Diagnosis not present

## 2022-05-01 DIAGNOSIS — M25551 Pain in right hip: Secondary | ICD-10-CM | POA: Diagnosis not present

## 2022-05-06 DIAGNOSIS — M75102 Unspecified rotator cuff tear or rupture of left shoulder, not specified as traumatic: Secondary | ICD-10-CM | POA: Diagnosis not present

## 2022-05-06 DIAGNOSIS — M25512 Pain in left shoulder: Secondary | ICD-10-CM | POA: Diagnosis not present

## 2022-05-06 DIAGNOSIS — M7541 Impingement syndrome of right shoulder: Secondary | ICD-10-CM | POA: Diagnosis not present

## 2022-05-07 DIAGNOSIS — M25551 Pain in right hip: Secondary | ICD-10-CM | POA: Diagnosis not present

## 2022-05-12 DIAGNOSIS — M25551 Pain in right hip: Secondary | ICD-10-CM | POA: Diagnosis not present

## 2022-05-15 DIAGNOSIS — M25551 Pain in right hip: Secondary | ICD-10-CM | POA: Diagnosis not present

## 2022-05-19 DIAGNOSIS — M25551 Pain in right hip: Secondary | ICD-10-CM | POA: Diagnosis not present

## 2022-05-19 IMAGING — DX DG CHEST 2V
2 series · 2 of 2 positions shown · non-contrast
Comparison: Radiograph 02/06/2020

CLINICAL DATA: Acute upper respiratory infection Acute bronchitis
with obstruction

EXAM:
CHEST - 2 VIEW

[chest pa]
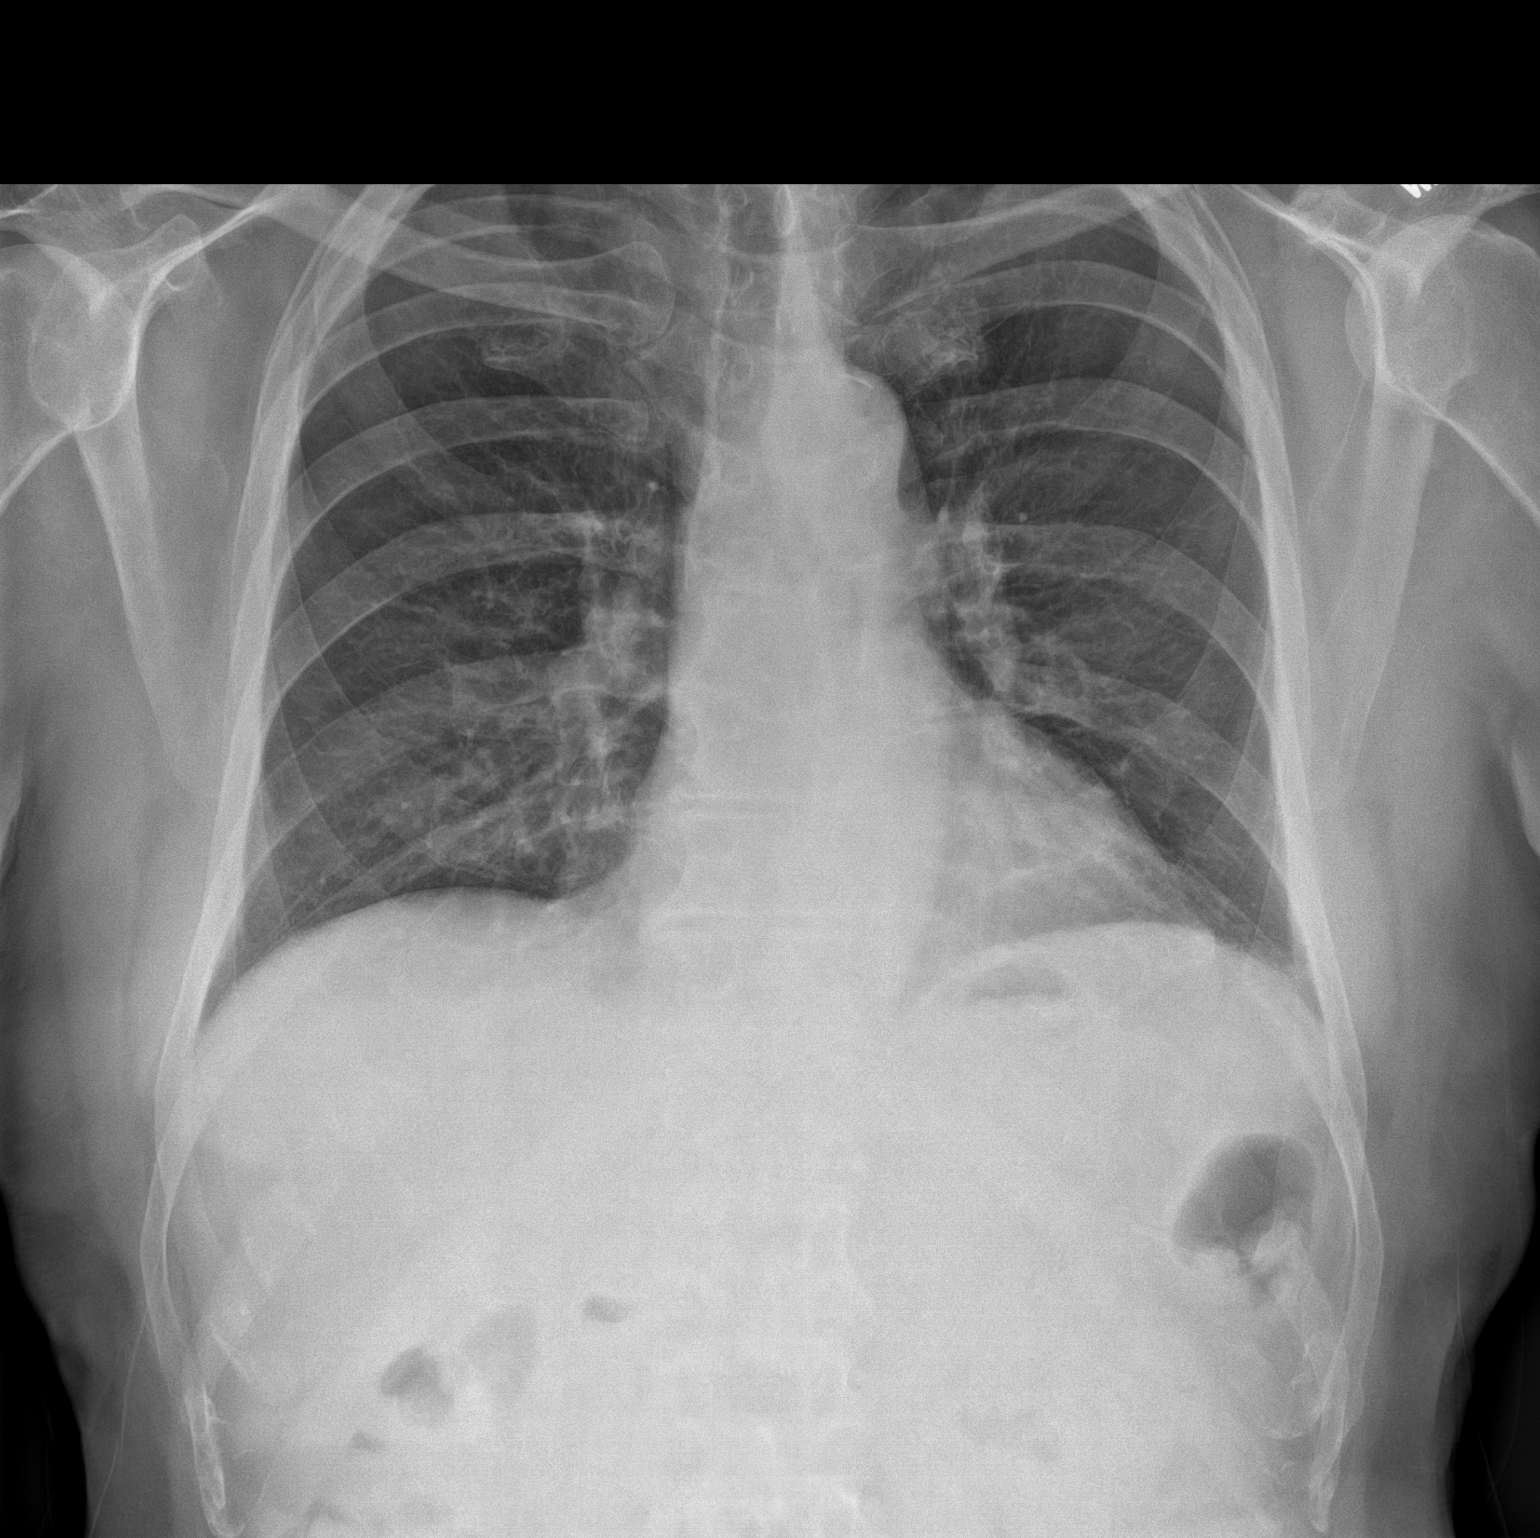

[chest lat]
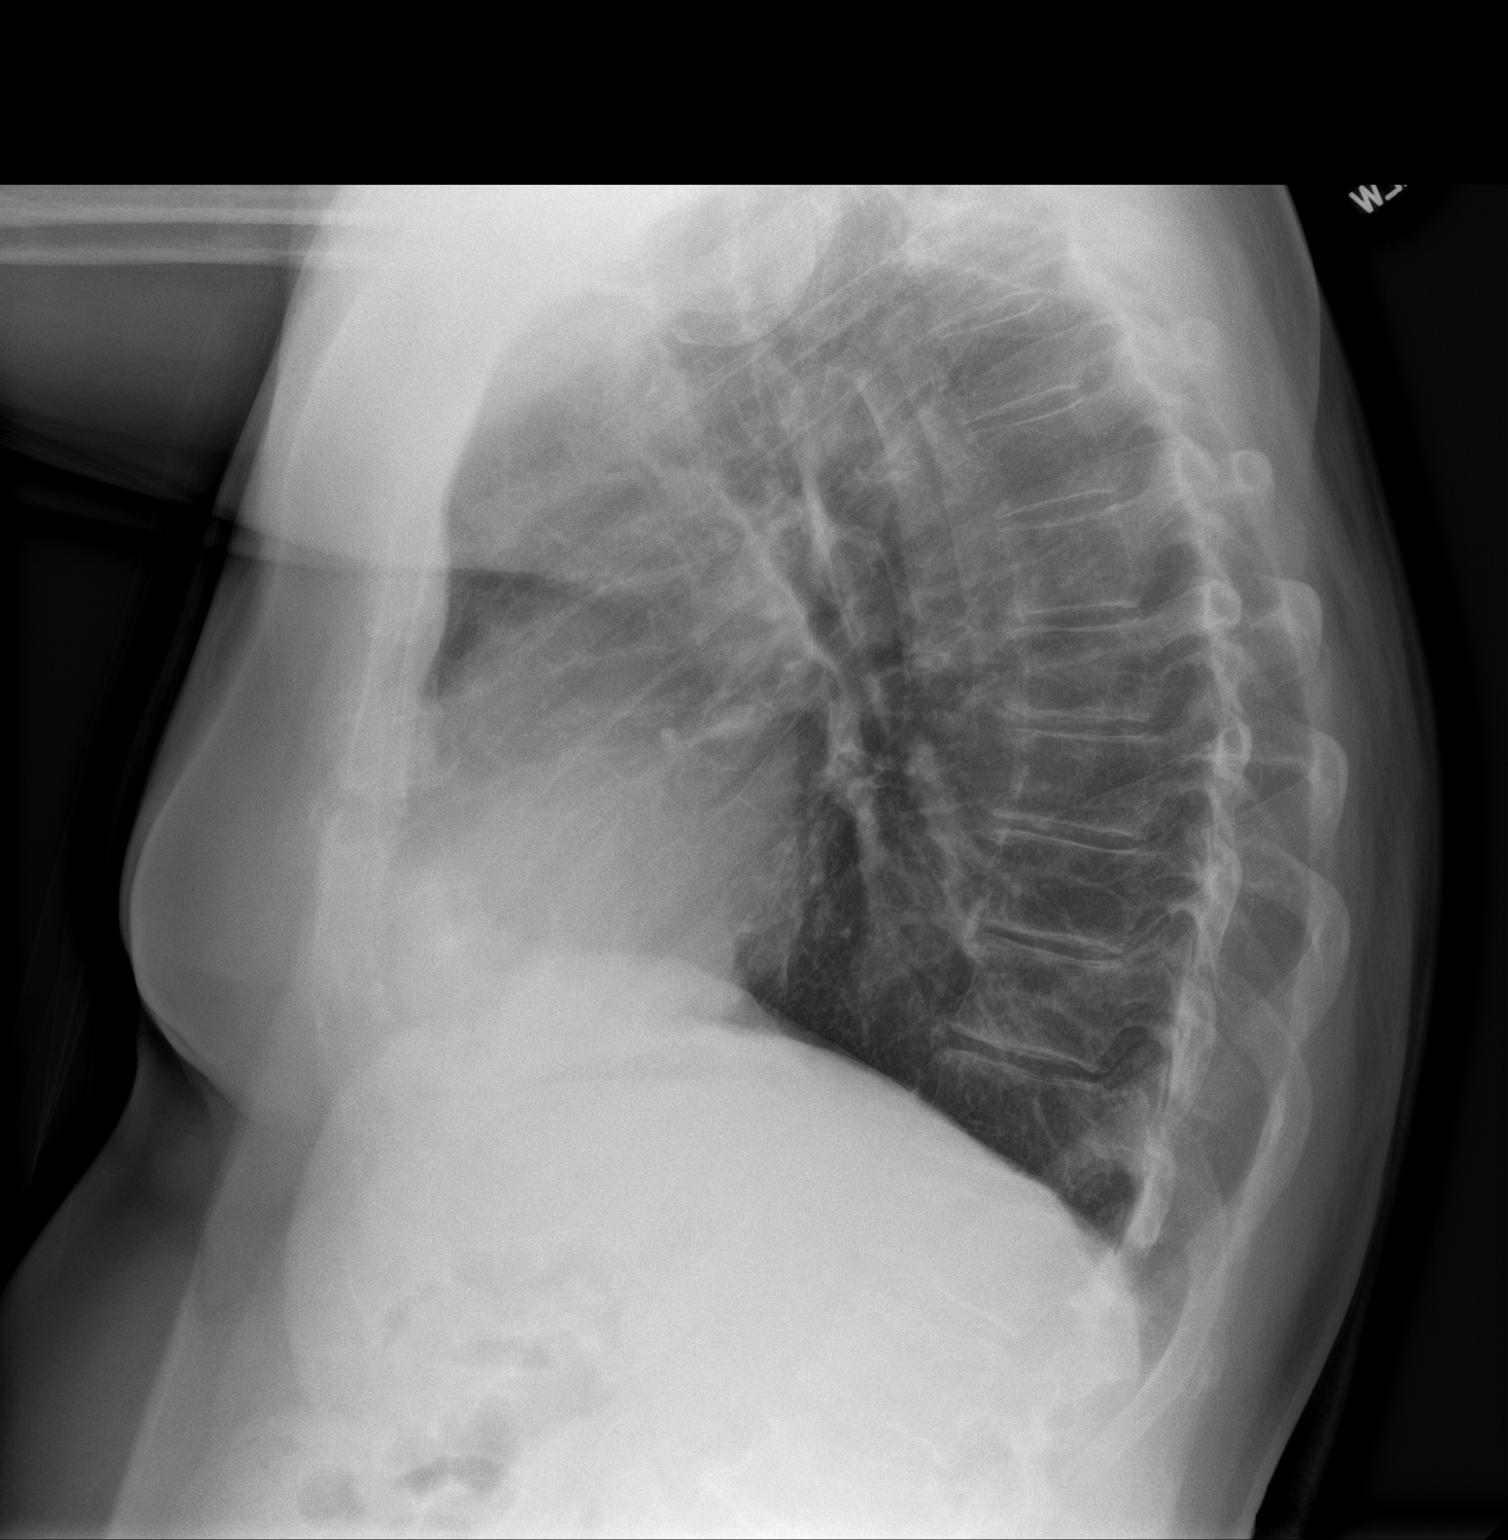

[2 of 2 positions shown; findings below may reference images not displayed]

FINDINGS: Unchanged cardiomediastinal silhouette. There is no focal airspace
consolidation. There is mild central bronchial wall thickening.
There is no pleural effusion. There is no pneumothorax. There is no
acute osseous abnormality.
IMPRESSION: Findings suggestive of bronchitis.  No focal airspace consolidation.

## 2022-05-26 DIAGNOSIS — M25551 Pain in right hip: Secondary | ICD-10-CM | POA: Diagnosis not present

## 2022-06-02 DIAGNOSIS — M25551 Pain in right hip: Secondary | ICD-10-CM | POA: Diagnosis not present

## 2022-06-13 DIAGNOSIS — R262 Difficulty in walking, not elsewhere classified: Secondary | ICD-10-CM | POA: Diagnosis not present

## 2022-06-13 DIAGNOSIS — M25551 Pain in right hip: Secondary | ICD-10-CM | POA: Diagnosis not present

## 2022-06-13 DIAGNOSIS — M25552 Pain in left hip: Secondary | ICD-10-CM | POA: Diagnosis not present

## 2022-06-19 DIAGNOSIS — R262 Difficulty in walking, not elsewhere classified: Secondary | ICD-10-CM | POA: Diagnosis not present

## 2022-06-19 DIAGNOSIS — M25552 Pain in left hip: Secondary | ICD-10-CM | POA: Diagnosis not present

## 2022-06-19 DIAGNOSIS — M25551 Pain in right hip: Secondary | ICD-10-CM | POA: Diagnosis not present

## 2022-06-26 DIAGNOSIS — M25551 Pain in right hip: Secondary | ICD-10-CM | POA: Diagnosis not present

## 2022-06-26 DIAGNOSIS — M25552 Pain in left hip: Secondary | ICD-10-CM | POA: Diagnosis not present

## 2022-06-26 DIAGNOSIS — R262 Difficulty in walking, not elsewhere classified: Secondary | ICD-10-CM | POA: Diagnosis not present

## 2022-07-03 DIAGNOSIS — M25551 Pain in right hip: Secondary | ICD-10-CM | POA: Diagnosis not present

## 2022-07-03 DIAGNOSIS — M25552 Pain in left hip: Secondary | ICD-10-CM | POA: Diagnosis not present

## 2022-07-03 DIAGNOSIS — R262 Difficulty in walking, not elsewhere classified: Secondary | ICD-10-CM | POA: Diagnosis not present

## 2022-07-08 DIAGNOSIS — E039 Hypothyroidism, unspecified: Secondary | ICD-10-CM | POA: Diagnosis not present

## 2022-07-08 DIAGNOSIS — R590 Localized enlarged lymph nodes: Secondary | ICD-10-CM | POA: Diagnosis not present

## 2022-07-08 DIAGNOSIS — E038 Other specified hypothyroidism: Secondary | ICD-10-CM | POA: Diagnosis not present

## 2022-07-23 DIAGNOSIS — C44629 Squamous cell carcinoma of skin of left upper limb, including shoulder: Secondary | ICD-10-CM | POA: Diagnosis not present

## 2022-07-23 DIAGNOSIS — L821 Other seborrheic keratosis: Secondary | ICD-10-CM | POA: Diagnosis not present

## 2022-07-23 DIAGNOSIS — L570 Actinic keratosis: Secondary | ICD-10-CM | POA: Diagnosis not present

## 2022-07-24 DIAGNOSIS — M25551 Pain in right hip: Secondary | ICD-10-CM | POA: Diagnosis not present

## 2022-07-24 DIAGNOSIS — M25552 Pain in left hip: Secondary | ICD-10-CM | POA: Diagnosis not present

## 2022-07-24 DIAGNOSIS — R262 Difficulty in walking, not elsewhere classified: Secondary | ICD-10-CM | POA: Diagnosis not present

## 2022-07-25 DIAGNOSIS — E039 Hypothyroidism, unspecified: Secondary | ICD-10-CM | POA: Diagnosis not present

## 2022-07-25 DIAGNOSIS — K219 Gastro-esophageal reflux disease without esophagitis: Secondary | ICD-10-CM | POA: Diagnosis not present

## 2022-07-25 DIAGNOSIS — R5383 Other fatigue: Secondary | ICD-10-CM | POA: Diagnosis not present

## 2022-07-31 DIAGNOSIS — R262 Difficulty in walking, not elsewhere classified: Secondary | ICD-10-CM | POA: Diagnosis not present

## 2022-07-31 DIAGNOSIS — H401131 Primary open-angle glaucoma, bilateral, mild stage: Secondary | ICD-10-CM | POA: Diagnosis not present

## 2022-07-31 DIAGNOSIS — M25552 Pain in left hip: Secondary | ICD-10-CM | POA: Diagnosis not present

## 2022-07-31 DIAGNOSIS — M25551 Pain in right hip: Secondary | ICD-10-CM | POA: Diagnosis not present

## 2022-08-14 DIAGNOSIS — M47896 Other spondylosis, lumbar region: Secondary | ICD-10-CM | POA: Diagnosis not present

## 2022-08-14 DIAGNOSIS — M25512 Pain in left shoulder: Secondary | ICD-10-CM | POA: Diagnosis not present

## 2022-08-14 DIAGNOSIS — M75101 Unspecified rotator cuff tear or rupture of right shoulder, not specified as traumatic: Secondary | ICD-10-CM | POA: Diagnosis not present

## 2022-08-15 DIAGNOSIS — R262 Difficulty in walking, not elsewhere classified: Secondary | ICD-10-CM | POA: Diagnosis not present

## 2022-08-15 DIAGNOSIS — M25551 Pain in right hip: Secondary | ICD-10-CM | POA: Diagnosis not present

## 2022-08-15 DIAGNOSIS — M25552 Pain in left hip: Secondary | ICD-10-CM | POA: Diagnosis not present

## 2022-08-21 DIAGNOSIS — R262 Difficulty in walking, not elsewhere classified: Secondary | ICD-10-CM | POA: Diagnosis not present

## 2022-08-21 DIAGNOSIS — M25552 Pain in left hip: Secondary | ICD-10-CM | POA: Diagnosis not present

## 2022-08-21 DIAGNOSIS — M25551 Pain in right hip: Secondary | ICD-10-CM | POA: Diagnosis not present

## 2022-09-04 ENCOUNTER — Emergency Department (HOSPITAL_COMMUNITY): Payer: Medicare Other

## 2022-09-04 ENCOUNTER — Emergency Department (HOSPITAL_COMMUNITY)
Admission: EM | Admit: 2022-09-04 | Discharge: 2022-09-04 | Disposition: A | Discharge status: Home / Self Care | Payer: Medicare Other | Attending: Emergency Medicine | Admitting: Emergency Medicine

## 2022-09-04 ENCOUNTER — Encounter (HOSPITAL_COMMUNITY): Payer: Self-pay

## 2022-09-04 DIAGNOSIS — D72829 Elevated white blood cell count, unspecified: Secondary | ICD-10-CM | POA: Diagnosis not present

## 2022-09-04 DIAGNOSIS — R55 Syncope and collapse: Secondary | ICD-10-CM

## 2022-09-04 DIAGNOSIS — E039 Hypothyroidism, unspecified: Secondary | ICD-10-CM | POA: Insufficient documentation

## 2022-09-04 DIAGNOSIS — R42 Dizziness and giddiness: Secondary | ICD-10-CM | POA: Diagnosis not present

## 2022-09-04 DIAGNOSIS — R748 Abnormal levels of other serum enzymes: Secondary | ICD-10-CM

## 2022-09-04 DIAGNOSIS — I700 Atherosclerosis of aorta: Secondary | ICD-10-CM | POA: Insufficient documentation

## 2022-09-04 DIAGNOSIS — K5730 Diverticulosis of large intestine without perforation or abscess without bleeding: Secondary | ICD-10-CM | POA: Diagnosis not present

## 2022-09-04 DIAGNOSIS — R6889 Other general symptoms and signs: Secondary | ICD-10-CM | POA: Diagnosis not present

## 2022-09-04 DIAGNOSIS — Z79899 Other long term (current) drug therapy: Secondary | ICD-10-CM | POA: Insufficient documentation

## 2022-09-04 DIAGNOSIS — N200 Calculus of kidney: Secondary | ICD-10-CM | POA: Diagnosis not present

## 2022-09-04 DIAGNOSIS — F0390 Unspecified dementia without behavioral disturbance: Secondary | ICD-10-CM | POA: Insufficient documentation

## 2022-09-04 DIAGNOSIS — R4182 Altered mental status, unspecified: Secondary | ICD-10-CM | POA: Insufficient documentation

## 2022-09-04 DIAGNOSIS — M4856XA Collapsed vertebra, not elsewhere classified, lumbar region, initial encounter for fracture: Secondary | ICD-10-CM | POA: Insufficient documentation

## 2022-09-04 DIAGNOSIS — D649 Anemia, unspecified: Secondary | ICD-10-CM | POA: Diagnosis not present

## 2022-09-04 DIAGNOSIS — R109 Unspecified abdominal pain: Secondary | ICD-10-CM | POA: Diagnosis not present

## 2022-09-04 DIAGNOSIS — R404 Transient alteration of awareness: Secondary | ICD-10-CM | POA: Diagnosis not present

## 2022-09-04 DIAGNOSIS — I499 Cardiac arrhythmia, unspecified: Secondary | ICD-10-CM | POA: Diagnosis not present

## 2022-09-04 DIAGNOSIS — Z743 Need for continuous supervision: Secondary | ICD-10-CM | POA: Diagnosis not present

## 2022-09-04 LAB — COMPREHENSIVE METABOLIC PANEL
ALT: 117 U/L — ABNORMAL HIGH (ref 0–44)
AST: 73 U/L — ABNORMAL HIGH (ref 15–41)
Albumin: 3.1 g/dL — ABNORMAL LOW (ref 3.5–5.0)
Alkaline Phosphatase: 354 U/L — ABNORMAL HIGH (ref 38–126)
Anion gap: 10 (ref 5–15)
BUN: 32 mg/dL — ABNORMAL HIGH (ref 8–23)
CO2: 18 mmol/L — ABNORMAL LOW (ref 22–32)
Calcium: 8.2 mg/dL — ABNORMAL LOW (ref 8.9–10.3)
Chloride: 109 mmol/L (ref 98–111)
Creatinine, Ser: 1.81 mg/dL — ABNORMAL HIGH (ref 0.61–1.24)
GFR, Estimated: 36 mL/min — ABNORMAL LOW (ref >60)
Glucose, Bld: 100 mg/dL — ABNORMAL HIGH (ref 70–99)
Potassium: 4.1 mmol/L (ref 3.5–5.1)
Sodium: 137 mmol/L (ref 135–145)
Total Bilirubin: 1.1 mg/dL (ref 0.3–1.2)
Total Protein: 5.7 g/dL — ABNORMAL LOW (ref 6.5–8.1)

## 2022-09-04 LAB — URINALYSIS, ROUTINE W REFLEX MICROSCOPIC
Bilirubin Urine: NEGATIVE
Glucose, UA: NEGATIVE mg/dL
Hgb urine dipstick: NEGATIVE
Ketones, ur: NEGATIVE mg/dL
Leukocytes,Ua: NEGATIVE
Nitrite: NEGATIVE
Protein, ur: NEGATIVE mg/dL
Specific Gravity, Urine: 1.017 (ref 1.005–1.030)
pH: 5 (ref 5.0–8.0)

## 2022-09-04 LAB — CBC WITH DIFFERENTIAL/PLATELET
Abs Immature Granulocytes: 0.09 10*3/uL — ABNORMAL HIGH (ref 0.00–0.07)
Basophils Absolute: 0.1 10*3/uL (ref 0.0–0.1)
Basophils Relative: 1 %
Eosinophils Absolute: 0.3 10*3/uL (ref 0.0–0.5)
Eosinophils Relative: 3 %
HCT: 31.9 % — ABNORMAL LOW (ref 39.0–52.0)
Hemoglobin: 10.3 g/dL — ABNORMAL LOW (ref 13.0–17.0)
Immature Granulocytes: 1 %
Lymphocytes Relative: 14 %
Lymphs Abs: 1.5 10*3/uL (ref 0.7–4.0)
MCH: 32.2 pg (ref 26.0–34.0)
MCHC: 32.3 g/dL (ref 30.0–36.0)
MCV: 99.7 fL (ref 80.0–100.0)
Monocytes Absolute: 1.1 10*3/uL — ABNORMAL HIGH (ref 0.1–1.0)
Monocytes Relative: 11 %
Neutro Abs: 7.5 10*3/uL (ref 1.7–7.7)
Neutrophils Relative %: 70 %
Platelets: 316 10*3/uL (ref 150–400)
RBC: 3.2 MIL/uL — ABNORMAL LOW (ref 4.22–5.81)
RDW: 15.8 % — ABNORMAL HIGH (ref 11.5–15.5)
WBC: 10.7 10*3/uL — ABNORMAL HIGH (ref 4.0–10.5)
nRBC: 0 % (ref 0.0–0.2)

## 2022-09-04 LAB — TROPONIN I (HIGH SENSITIVITY): Troponin I (High Sensitivity): 5 ng/L (ref <18)

## 2022-09-04 MED ORDER — HALOPERIDOL LACTATE 5 MG/ML IJ SOLN
1.0000 mg | Freq: Once | INTRAMUSCULAR | Status: AC
  Administered 2022-09-04: 1 mg via INTRAMUSCULAR
  Filled 2022-09-04: qty 1

## 2022-09-04 MED ORDER — SODIUM CHLORIDE 0.9 % IV BOLUS
500.0000 mL | Freq: Once | INTRAVENOUS | Status: AC
  Administered 2022-09-04: 500 mL via INTRAVENOUS

## 2022-09-04 NOTE — Discharge Instructions (Signed)
Make an appointment to follow-up within the next few days with your primary care doctor for recheck and also to have your labs rechecked.  Return to the emergency room if you have any worsening symptoms.

## 2022-09-04 NOTE — ED Triage Notes (Signed)
Pt presents to ED for evaluation of episode of dizziness when getting up from the couch tonight.

## 2022-09-04 NOTE — ED Notes (Signed)
Pt ambulated the hallway w/o assistance. Pt states he feels steady on his feet at this time.

## 2022-09-04 NOTE — ED Notes (Signed)
Pt is restless. Wife called me in there to ask what they were waiting on since all labs were back.  She is ready to go home.

## 2022-09-04 NOTE — ED Provider Notes (Signed)
[place] EMERGENCY DEPARTMENT AT [facility] Provider Note   CSN: [id number] Arrival date & time: 09/04/22  0117     History  Chief Complaint  Patient presents with   Near Syncope    Alfred Clark is a 87 y.o. male.  The history is provided by the patient and medical records.  Near Syncope  Alfred Clark is a 87 y.o. male who presents to the Emergency Department complaining of altered mental status, near syncope.  Level 5 caveat due to dementia.  History is provided by the friend that is at the bedside.  He presents the emergency department for evaluation of an episode that occurred when he was with his caregiver, who is not available for history.  She sits with him at night and the report is that he was having trouble getting up and had stuttering and had trouble getting his words out and said he was seeing 4 of things.  EMS had a blood pressure of 89/50.  Patient denies headache, chest pain, abdominal pain.  He does have dementia and severe hearing loss, which limits history taking.  He also has a history of hypothyroidism.  No reports of recent illnesses, injuries or hematochezia or melena.     Home Medications Prior to Admission medications   Medication Sig Start Date End Date Taking? Authorizing Provider  acetaminophen (TYLENOL) 650 MG CR tablet Take 1,300 mg by mouth every 8 (eight) hours as needed for pain.    [provider]  buPROPion (WELLBUTRIN XL) 150 MG 24 hr tablet TAKE 1 TABLET(150 MG) BY MOUTH DAILY 04/30/18   Reed, Tiffany L, DO  donepezil (ARICEPT) 23 MG TABS tablet Take 23 mg by mouth at bedtime.    [provider]  fluorouracil (EFUDEX) 5 % cream Apply topically 2 (two) times daily.  01/06/18   [provider]  ipratropium (ATROVENT) 0.03 % nasal spray Place 2 sprays into both nostrils every 12 (twelve) hours.    [provider]  latanoprost (XALATAN) 0.005 % ophthalmic solution Place 1 drop into both eyes nightly  for 30 days. 08/31/17   [provider]  levothyroxine (SYNTHROID) 75 MCG tablet Take 75 mcg by mouth daily before breakfast.    [provider]  memantine (NAMENDA XR) 28 MG CP24 24 hr capsule Take 28 mg by mouth daily.    [provider]  Multiple Vitamins-Minerals (CENTRUM SILVER 50+MEN PO) Take 1 tablet by mouth at bedtime.    [provider]  OVER THE COUNTER MEDICATION Aller Tec 24 hour    [provider]      Allergies    Sulfa antibiotics    Review of Systems   Review of Systems  Cardiovascular:  Positive for near-syncope.  All other systems reviewed and are negative.   Physical Exam Updated Vital Signs BP 119/69 (BP Location: Left Arm)   Pulse 70   Temp 98.2 F (36.8 C) (Oral)   Resp 16   Ht 5\' 11"  (1.803 m)   Wt 86.6 kg   SpO2 98%   BMI 26.63 kg/m  Physical Exam Vitals and nursing note reviewed.  Constitutional:      Appearance: He is well-developed.  HENT:     Head: Normocephalic and atraumatic.  Cardiovascular:     Rate and Rhythm: Normal rate and regular rhythm.     Heart sounds: No murmur heard. Pulmonary:     Effort: Pulmonary effort is normal. No respiratory distress.  Breath sounds: Normal breath sounds.  Abdominal:     Palpations: Abdomen is soft.     Tenderness: There is no abdominal tenderness. There is no guarding or rebound.  Musculoskeletal:        General: No swelling or tenderness.  Skin:    General: Skin is warm and dry.  Neurological:     Mental Status: He is alert.     Comments: Oriented to person. Disoriented to time and recent events. Visual fields grossly intact.  5/5 strength in all four extremities.  Confused.   Psychiatric:        Behavior: Behavior normal.     ED Results / Procedures / Treatments   Labs (all labs ordered are listed, but only abnormal results are displayed) Labs Reviewed - No data to display  EKG None  Radiology No results found.  Procedures Procedures     Medications Ordered in ED Medications - No data to display  ED Course/ Medical Decision Making/ A&P                             Medical Decision Making Amount and/or Complexity of Data Reviewed Labs: ordered. Radiology: ordered.  Risk Prescription drug management.   Patient with history of hypothyroidism, dementia coming from home for evaluation following a near syncopal episode, did have a blood pressure in the 80s with EMS per friend at the bedside.  Patient is asymptomatic at time of assessment, confused but appears to be at his baseline.  He has no focal deficits on examination.  Labs significant for elevation in his transaminases, alk phos with stable renal insufficiency.  CBC with mild leukocytosis.  Given borderline blood pressure, lab changes CT abdomen pelvis obtained to rule out intra-abdominal pathology.  CT scan with possible UTI with bladder wall thickening.  Patient without abdominal tenderness on examination.  CT head is negative for acute process.  Patient care transferred pending urinalysis and repeat evaluation.        Final Clinical Impression(s) / ED Diagnoses Final diagnoses:  None    Rx / DC Orders ED Discharge Orders     None         Tilden Fossa, MD 09/04/22 [phone]

## 2022-09-04 NOTE — ED Provider Notes (Addendum)
Patient is a 87 year old male who presents after a near syncopal episode.  Care was taken over from Dr. Madilyn Hook.  He was awaiting a urinalysis.  This does not show any signs of infection.  His labs are reviewed and show transaminitis and mild anemia.  He had a CT scan that did not show any acute abnormality.  Head CT shows no acute abnormality.  I discussed findings with patient's caretaker Olegario Messier who is at bedside and also patient's son who was on the telephone.  He is able to ambulate here in the ED without any dizziness or ataxia.  He was given some IV fluids.  He was hypotensive initially in the event but has not had any episodes of hypotension in the ED.  Appears appropriate for discharge.  They are anxious to leave the ED.  He was discharged home in good condition.  It was instructed to the caretaker and the son that patient needs to follow-up with his primary care doctor for recheck and will need to have his labs rechecked including his liver enzymes and hemoglobin.  Also that he had some fluid behind his left TM that we will need to be followed.   Rolan Bucco, MD 09/04/22 1004    Rolan Bucco, MD 09/04/22 1005

## 2022-09-04 NOTE — ED Notes (Signed)
Patient is laying in bed peacefully with wife at bedside. Pt in no obvious distress with call light in reach.

## 2022-09-09 DIAGNOSIS — R599 Enlarged lymph nodes, unspecified: Secondary | ICD-10-CM | POA: Diagnosis not present

## 2022-09-09 DIAGNOSIS — R7989 Other specified abnormal findings of blood chemistry: Secondary | ICD-10-CM | POA: Diagnosis not present

## 2022-09-09 DIAGNOSIS — N1832 Chronic kidney disease, stage 3b: Secondary | ICD-10-CM | POA: Diagnosis not present

## 2022-09-09 DIAGNOSIS — E039 Hypothyroidism, unspecified: Secondary | ICD-10-CM | POA: Diagnosis not present

## 2022-09-10 ENCOUNTER — Other Ambulatory Visit: Payer: Self-pay | Admitting: Family Medicine

## 2022-09-10 DIAGNOSIS — R7989 Other specified abnormal findings of blood chemistry: Secondary | ICD-10-CM

## 2022-09-10 DIAGNOSIS — R599 Enlarged lymph nodes, unspecified: Secondary | ICD-10-CM

## 2022-10-09 DIAGNOSIS — Z0000 Encounter for general adult medical examination without abnormal findings: Secondary | ICD-10-CM | POA: Diagnosis not present

## 2022-10-09 DIAGNOSIS — R945 Abnormal results of liver function studies: Secondary | ICD-10-CM | POA: Diagnosis not present

## 2022-10-09 DIAGNOSIS — K219 Gastro-esophageal reflux disease without esophagitis: Secondary | ICD-10-CM | POA: Diagnosis not present

## 2022-10-09 DIAGNOSIS — R634 Abnormal weight loss: Secondary | ICD-10-CM | POA: Diagnosis not present

## 2022-10-10 ENCOUNTER — Ambulatory Visit
Admission: RE | Admit: 2022-10-10 | Discharge: 2022-10-10 | Disposition: A | Discharge status: Home / Self Care | Payer: Medicare Other | Source: Ambulatory Visit | Attending: Family Medicine | Admitting: Family Medicine

## 2022-10-10 DIAGNOSIS — R599 Enlarged lymph nodes, unspecified: Secondary | ICD-10-CM

## 2022-10-10 DIAGNOSIS — R7989 Other specified abnormal findings of blood chemistry: Secondary | ICD-10-CM | POA: Diagnosis not present

## 2022-10-10 DIAGNOSIS — R220 Localized swelling, mass and lump, head: Secondary | ICD-10-CM | POA: Diagnosis not present

## 2022-10-10 DIAGNOSIS — R932 Abnormal findings on diagnostic imaging of liver and biliary tract: Secondary | ICD-10-CM | POA: Diagnosis not present

## 2022-10-14 ENCOUNTER — Other Ambulatory Visit: Payer: Self-pay | Admitting: Family Medicine

## 2022-10-14 DIAGNOSIS — K118 Other diseases of salivary glands: Secondary | ICD-10-CM

## 2022-10-20 ENCOUNTER — Ambulatory Visit
Admission: RE | Admit: 2022-10-20 | Discharge: 2022-10-20 | Disposition: A | Discharge status: Home / Self Care | Payer: Medicare Other | Source: Ambulatory Visit | Attending: Family Medicine | Admitting: Family Medicine

## 2022-10-20 DIAGNOSIS — H6982 Other specified disorders of Eustachian tube, left ear: Secondary | ICD-10-CM | POA: Diagnosis not present

## 2022-10-20 DIAGNOSIS — K118 Other diseases of salivary glands: Secondary | ICD-10-CM

## 2022-10-20 MED ORDER — IOPAMIDOL (ISOVUE-300) INJECTION 61%
75.0000 mL | Freq: Once | INTRAVENOUS | Status: AC | PRN
  Administered 2022-10-20: 60 mL via INTRAVENOUS

## 2022-10-21 DIAGNOSIS — L600 Ingrowing nail: Secondary | ICD-10-CM | POA: Diagnosis not present

## 2022-10-21 DIAGNOSIS — H6983 Other specified disorders of Eustachian tube, bilateral: Secondary | ICD-10-CM | POA: Diagnosis not present

## 2022-11-05 ENCOUNTER — Ambulatory Visit: Payer: Medicare Other | Admitting: Podiatry

## 2022-11-05 VITALS — BP 156/74

## 2022-11-05 DIAGNOSIS — L600 Ingrowing nail: Secondary | ICD-10-CM
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
## 2022-11-17 DIAGNOSIS — H6522 Chronic serous otitis media, left ear: Secondary | ICD-10-CM | POA: Diagnosis not present

## 2022-11-17 DIAGNOSIS — K118 Other diseases of salivary glands: Secondary | ICD-10-CM | POA: Diagnosis not present

## 2023-02-17 DIAGNOSIS — H52203 Unspecified astigmatism, bilateral: Secondary | ICD-10-CM | POA: Diagnosis not present

## 2023-02-17 DIAGNOSIS — H401131 Primary open-angle glaucoma, bilateral, mild stage: Secondary | ICD-10-CM | POA: Diagnosis not present

## 2023-06-17 DIAGNOSIS — N1832 Chronic kidney disease, stage 3b: Secondary | ICD-10-CM | POA: Diagnosis not present

## 2023-06-17 DIAGNOSIS — E039 Hypothyroidism, unspecified: Secondary | ICD-10-CM | POA: Diagnosis not present

## 2023-07-01 DIAGNOSIS — M25512 Pain in left shoulder: Secondary | ICD-10-CM | POA: Diagnosis not present

## 2023-07-01 DIAGNOSIS — M75101 Unspecified rotator cuff tear or rupture of right shoulder, not specified as traumatic: Secondary | ICD-10-CM | POA: Diagnosis not present

## 2023-07-17 DIAGNOSIS — S299XXA Unspecified injury of thorax, initial encounter: Secondary | ICD-10-CM | POA: Diagnosis not present

## 2023-07-21 ENCOUNTER — Ambulatory Visit: Payer: Self-pay

## 2023-07-21 ENCOUNTER — Other Ambulatory Visit: Payer: Self-pay

## 2023-07-21 ENCOUNTER — Emergency Department (HOSPITAL_COMMUNITY)
Admission: EM | Admit: 2023-07-21 | Discharge: 2023-07-21 | Disposition: A | Discharge status: Home / Self Care | Attending: Emergency Medicine | Admitting: Emergency Medicine

## 2023-07-21 ENCOUNTER — Emergency Department (HOSPITAL_COMMUNITY)

## 2023-07-21 DIAGNOSIS — R6889 Other general symptoms and signs: Secondary | ICD-10-CM | POA: Diagnosis not present

## 2023-07-21 DIAGNOSIS — F0390 Unspecified dementia without behavioral disturbance: Secondary | ICD-10-CM | POA: Insufficient documentation

## 2023-07-21 DIAGNOSIS — M25552 Pain in left hip: Secondary | ICD-10-CM | POA: Diagnosis not present

## 2023-07-21 DIAGNOSIS — M51379 Other intervertebral disc degeneration, lumbosacral region without mention of lumbar back pain or lower extremity pain: Secondary | ICD-10-CM | POA: Diagnosis not present

## 2023-07-21 DIAGNOSIS — W19XXXA Unspecified fall, initial encounter: Secondary | ICD-10-CM | POA: Diagnosis not present

## 2023-07-21 DIAGNOSIS — R109 Unspecified abdominal pain: Secondary | ICD-10-CM | POA: Insufficient documentation

## 2023-07-21 DIAGNOSIS — R1032 Left lower quadrant pain: Secondary | ICD-10-CM | POA: Diagnosis not present

## 2023-07-21 DIAGNOSIS — M1612 Unilateral primary osteoarthritis, left hip: Secondary | ICD-10-CM | POA: Diagnosis not present

## 2023-07-21 DIAGNOSIS — R9431 Abnormal electrocardiogram [ECG] [EKG]: Secondary | ICD-10-CM | POA: Diagnosis not present

## 2023-07-21 MED ORDER — LIDOCAINE 5 % EX PTCH
1.0000 | MEDICATED_PATCH | CUTANEOUS | Status: DC
  Administered 2023-07-21: 1 via TRANSDERMAL
  Filled 2023-07-21: qty 1

## 2023-07-21 MED ORDER — METHOCARBAMOL 500 MG PO TABS: 500.0000 mg | ORAL_TABLET | Freq: Two times a day (BID) | ORAL | 0 refills | Status: DC

## 2023-07-21 MED ORDER — LIDOCAINE 5 % EX PTCH: 1.0000 | MEDICATED_PATCH | CUTANEOUS | 0 refills | Status: DC

## 2023-07-21 NOTE — ED Triage Notes (Signed)
 Per ems pt wife reports pt fell on Friday and has been c/o L flank pain and back pain.
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
## 2023-07-21 NOTE — ED Provider Notes (Signed)
 [place] EMERGENCY DEPARTMENT AT [facility] Provider Note   CSN: [id number] Arrival date & time: 07/21/23  1112     History  Chief Complaint  Patient presents with   Fall   Rib Injury    Alfred Clark is a 88 y.o. male.  88 year old male with history of dementia presents due to pain to his left flank.  No reported history of hematuria.  Unclear how this happened.  No reported history of head trauma.  Complains of the pain is worse with movement to his left flank.  Patient is not on blood thinners.  Denies any hip discomfort at this time.  Presents via EMS.      Home Medications Prior to Admission medications   Medication Sig Start Date End Date Taking? Authorizing Provider  acetaminophen (TYLENOL) 650 MG CR tablet Take 1,300 mg by mouth every 8 (eight) hours as needed for pain.    [provider]  buPROPion (WELLBUTRIN XL) 150 MG 24 hr tablet TAKE 1 TABLET(150 MG) BY MOUTH DAILY 04/30/18   Reed, Tiffany L, DO  donepezil (ARICEPT) 23 MG TABS tablet Take 23 mg by mouth at bedtime.    [provider]  fluorouracil (EFUDEX) 5 % cream Apply topically 2 (two) times daily.  01/06/18   [provider]  ipratropium (ATROVENT) 0.03 % nasal spray Place 2 sprays into both nostrils every 12 (twelve) hours.    [provider]  latanoprost (XALATAN) 0.005 % ophthalmic solution Place 1 drop into both eyes nightly for 30 days. 08/31/17   [provider]  levothyroxine (SYNTHROID) 75 MCG tablet Take 75 mcg by mouth daily before breakfast.    [provider]  memantine (NAMENDA XR) 28 MG CP24 24 hr capsule Take 28 mg by mouth daily.    [provider]  Multiple Vitamins-Minerals (CENTRUM SILVER 50+MEN PO) Take 1 tablet by mouth at bedtime.    [provider]  OVER THE COUNTER MEDICATION Aller Tec 24 hour    [provider]      Allergies    Sulfa antibiotics    Review of Systems   Review of Systems   All other systems reviewed and are negative.   Physical Exam Updated Vital Signs BP 126/63   Temp 98.7 F (37.1 C) (Oral)   Resp (!) 23   SpO2 96%  Physical Exam Vitals and nursing note reviewed.  Constitutional:      General: He is not in acute distress.    Appearance: Normal appearance. He is well-developed. He is not toxic-appearing.  HENT:     Head: Normocephalic and atraumatic.  Eyes:     General: Lids are normal.     Conjunctiva/sclera: Conjunctivae normal.     Pupils: Pupils are equal, round, and reactive to light.  Neck:     Thyroid: No thyroid mass.     Trachea: No tracheal deviation.  Cardiovascular:     Rate and Rhythm: Normal rate and regular rhythm.     Heart sounds: Normal heart sounds. No murmur heard.    No gallop.  Pulmonary:     Effort: Pulmonary effort is normal. No respiratory distress.     Breath sounds: Normal breath sounds. No stridor. No decreased breath sounds, wheezing, rhonchi or rales.  Abdominal:     General: There is no distension.     Palpations: Abdomen is soft.     Tenderness: There is no abdominal tenderness. There is no rebound.  Musculoskeletal:  General: No tenderness. Normal range of motion.     Cervical back: Normal range of motion and neck supple.       Back:  Skin:    General: Skin is warm and dry.     Findings: No abrasion or rash.  Neurological:     Mental Status: He is alert and oriented to person, place, and time. Mental status is at baseline.     GCS: GCS eye subscore is 4. GCS verbal subscore is 5. GCS motor subscore is 6.     Cranial Nerves: No cranial nerve deficit.     Sensory: No sensory deficit.     Motor: Motor function is intact.  Psychiatric:        Attention and Perception: Attention normal.        Speech: Speech normal.        Behavior: Behavior normal.    ED Results / Procedures / Treatments   Labs (all labs ordered are listed, but only abnormal results are displayed) Labs Reviewed - No data to  display  EKG None  Radiology No results found.  Procedures Procedures    Medications Ordered in ED Medications - No data to display  ED Course/ Medical Decision Making/ A&P                                 Medical Decision Making Amount and/or Complexity of Data Reviewed Radiology: ordered.   Patient next day of his left ribs, LS-spine, hip all which were negative.  Will prescribe Lidoderm patches as well as muscle actions and discharged back home.  Family at bedside and they were informed of plan        Final Clinical Impression(s) / ED Diagnoses Final diagnoses:  None    Rx / DC Orders ED Discharge Orders     None         Lorre Nick, MD 07/21/23 1517

## 2023-07-21 NOTE — Telephone Encounter (Signed)
 Called phone number listed by RN Christen Bame, no answer. Left a voicemail. Called patient's phone number and spoke with [facility]. She states she is his caregiver and patient is being loaded up in the ambulance now and she does not need any assistance. She states she does not know why we are calling her, informed her the call she made earlier was disconnected and we were just trying to reach back out to assist with triage. Declined triage services at this time. Closing encounter.    Copied from CRM [id number]. Topic: Clinical - Red Word Triage >> Jul 21, 2023 10:13 AM Turkey B wrote: [facility] that prompted transfer to Nurse Triage: pt's caregiver called in , pt fell Friday, left side and back pain

## 2023-07-30 DIAGNOSIS — L03114 Cellulitis of left upper limb: Secondary | ICD-10-CM | POA: Diagnosis not present

## 2023-07-30 DIAGNOSIS — S4992XD Unspecified injury of left shoulder and upper arm, subsequent encounter: Secondary | ICD-10-CM | POA: Diagnosis not present

## 2023-08-06 DIAGNOSIS — R634 Abnormal weight loss: Secondary | ICD-10-CM | POA: Diagnosis not present

## 2023-08-18 DIAGNOSIS — H401131 Primary open-angle glaucoma, bilateral, mild stage: Secondary | ICD-10-CM | POA: Diagnosis not present

## 2023-09-03 DIAGNOSIS — R944 Abnormal results of kidney function studies: Secondary | ICD-10-CM | POA: Diagnosis not present

## 2023-09-03 DIAGNOSIS — R748 Abnormal levels of other serum enzymes: Secondary | ICD-10-CM | POA: Diagnosis not present

## 2023-09-07 DIAGNOSIS — S80211A Abrasion, right knee, initial encounter: Secondary | ICD-10-CM | POA: Diagnosis not present

## 2023-09-07 DIAGNOSIS — M79641 Pain in right hand: Secondary | ICD-10-CM | POA: Diagnosis not present

## 2023-09-07 DIAGNOSIS — M25551 Pain in right hip: Secondary | ICD-10-CM | POA: Diagnosis not present

## 2023-11-20 DIAGNOSIS — R748 Abnormal levels of other serum enzymes: Secondary | ICD-10-CM | POA: Diagnosis not present

## 2023-12-23 DIAGNOSIS — K219 Gastro-esophageal reflux disease without esophagitis: Secondary | ICD-10-CM | POA: Diagnosis not present

## 2023-12-23 DIAGNOSIS — N1832 Chronic kidney disease, stage 3b: Secondary | ICD-10-CM | POA: Diagnosis not present

## 2023-12-23 DIAGNOSIS — Z0000 Encounter for general adult medical examination without abnormal findings: Secondary | ICD-10-CM | POA: Diagnosis not present

## 2023-12-23 DIAGNOSIS — Z23 Encounter for immunization: Secondary | ICD-10-CM | POA: Diagnosis not present

## 2024-01-24 ENCOUNTER — Ambulatory Visit (HOSPITAL_COMMUNITY)
Admission: EM | Admit: 2024-01-24 | Discharge: 2024-01-24 | Disposition: A | Discharge status: Home / Self Care | Attending: Urology | Admitting: Urology

## 2024-01-24 DIAGNOSIS — Z79899 Other long term (current) drug therapy: Secondary | ICD-10-CM | POA: Diagnosis not present

## 2024-01-24 DIAGNOSIS — F03911 Unspecified dementia, unspecified severity, with agitation: Secondary | ICD-10-CM | POA: Diagnosis not present

## 2024-01-24 DIAGNOSIS — F03918 Unspecified dementia, unspecified severity, with other behavioral disturbance: Secondary | ICD-10-CM | POA: Insufficient documentation

## 2024-01-24 NOTE — Discharge Instructions (Addendum)
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
## 2024-02-03 ENCOUNTER — Ambulatory Visit: Admitting: Neurology

## 2024-02-15 ENCOUNTER — Telehealth: Payer: Self-pay | Admitting: Diagnostic Neuroimaging

## 2024-02-15 ENCOUNTER — Ambulatory Visit: Admitting: Diagnostic Neuroimaging

## 2024-02-15 ENCOUNTER — Encounter: Payer: Self-pay | Admitting: Diagnostic Neuroimaging

## 2024-02-15 VITALS — BP 109/60 | HR 67 | Ht 70.0 in | Wt 176.0 lb

## 2024-02-15 DIAGNOSIS — F03C11 Unspecified dementia, severe, with agitation: Secondary | ICD-10-CM
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
## 2024-02-15 NOTE — Telephone Encounter (Signed)
 Patient's friend, Donny Garret asking Dr. Margaret to call patient's son, Salathiel Ferrara [phone]) to discuss diagnosis and condition. To be able to make a decision about placement. Patient's son Jacquelyn Antony is on the last [facility] 9/31/2021.

## 2024-02-15 NOTE — Patient Instructions (Addendum)
  SEVERE DEMENTIA WITH BEHAVIORAL CHANGES - continue citalopram 20mg  daily - increase quetiapine to 50mg  twice a day; consider rexulti - needs higher level; reviewed safety considerations with patient's friend; need to get his son involved in decision for care planning because he is HCPOA; I recommend to transition to memory care center

## 2024-02-16 NOTE — Telephone Encounter (Signed)
 LMVM for pts son, Alm to return call about fathers diagnosis and condition.

## 2024-02-17 NOTE — Telephone Encounter (Signed)
 Son, Alfred Clark, ([place]) called back.  I relayed recommendations to son about his father and about his worsening dementia  agitation, aggressive behavior.  Receommendation about memory care unit placement.  I mailed AVS to Alfred Clark to his address in [place], [place].  [address], [address].  He did not have any more questions.  [facility] lives in [facility].  Has 24/7 caregivers.  He will look into it.

## 2024-02-18 NOTE — Telephone Encounter (Signed)
 I called patient's son.  No answer.  I am available to discuss further if he calls back. -VRP

## 2024-03-30 ENCOUNTER — Encounter (HOSPITAL_COMMUNITY): Payer: Self-pay | Admitting: Emergency Medicine

## 2024-03-30 ENCOUNTER — Emergency Department (HOSPITAL_COMMUNITY)
Admission: EM | Admit: 2024-03-30 | Discharge: 2024-03-31 | Disposition: A | Discharge status: Skilled Nursing Facility | Source: Home / Self Care | Attending: Emergency Medicine | Admitting: Emergency Medicine

## 2024-03-30 ENCOUNTER — Other Ambulatory Visit: Payer: Self-pay

## 2024-03-30 DIAGNOSIS — F0390 Unspecified dementia without behavioral disturbance: Secondary | ICD-10-CM | POA: Diagnosis not present

## 2024-03-30 DIAGNOSIS — W25XXXA Contact with sharp glass, initial encounter: Secondary | ICD-10-CM | POA: Diagnosis not present

## 2024-03-30 DIAGNOSIS — R456 Violent behavior: Secondary | ICD-10-CM | POA: Diagnosis not present

## 2024-03-30 DIAGNOSIS — S51812A Laceration without foreign body of left forearm, initial encounter: Secondary | ICD-10-CM | POA: Diagnosis not present

## 2024-03-30 DIAGNOSIS — S59912A Unspecified injury of left forearm, initial encounter: Secondary | ICD-10-CM | POA: Diagnosis present

## 2024-03-30 DIAGNOSIS — N183 Chronic kidney disease, stage 3 unspecified: Secondary | ICD-10-CM | POA: Diagnosis not present

## 2024-03-30 DIAGNOSIS — Z23 Encounter for immunization: Secondary | ICD-10-CM | POA: Diagnosis not present

## 2024-03-30 DIAGNOSIS — R4689 Other symptoms and signs involving appearance and behavior: Secondary | ICD-10-CM
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
## 2024-03-31 ENCOUNTER — Non-Acute Institutional Stay (SKILLED_NURSING_FACILITY): Payer: Self-pay | Admitting: Adult Health

## 2024-03-31 ENCOUNTER — Emergency Department (HOSPITAL_COMMUNITY)

## 2024-03-31 ENCOUNTER — Encounter: Payer: Self-pay | Admitting: Adult Health

## 2024-03-31 DIAGNOSIS — R748 Abnormal levels of other serum enzymes: Secondary | ICD-10-CM

## 2024-03-31 DIAGNOSIS — K219 Gastro-esophageal reflux disease without esophagitis: Secondary | ICD-10-CM

## 2024-03-31 DIAGNOSIS — G301 Alzheimer's disease with late onset: Secondary | ICD-10-CM | POA: Diagnosis not present

## 2024-03-31 DIAGNOSIS — D638 Anemia in other chronic diseases classified elsewhere: Secondary | ICD-10-CM

## 2024-03-31 DIAGNOSIS — K118 Other diseases of salivary glands: Secondary | ICD-10-CM | POA: Diagnosis not present

## 2024-03-31 DIAGNOSIS — F02C11 Dementia in other diseases classified elsewhere, severe, with agitation: Secondary | ICD-10-CM | POA: Diagnosis not present

## 2024-03-31 DIAGNOSIS — N1832 Chronic kidney disease, stage 3b: Secondary | ICD-10-CM

## 2024-03-31 DIAGNOSIS — E039 Hypothyroidism, unspecified: Secondary | ICD-10-CM | POA: Diagnosis not present

## 2024-03-31 DIAGNOSIS — H409 Unspecified glaucoma: Secondary | ICD-10-CM | POA: Diagnosis not present

## 2024-03-31 LAB — CBC WITH DIFFERENTIAL/PLATELET
Abs Immature Granulocytes: 0.06 K/uL (ref 0.00–0.07)
Basophils Absolute: 0.2 K/uL — ABNORMAL HIGH (ref 0.0–0.1)
Basophils Relative: 2 %
Eosinophils Absolute: 0.3 K/uL (ref 0.0–0.5)
Eosinophils Relative: 3 %
HCT: 31.5 % — ABNORMAL LOW (ref 39.0–52.0)
Hemoglobin: 9.8 g/dL — ABNORMAL LOW (ref 13.0–17.0)
Immature Granulocytes: 1 %
Lymphocytes Relative: 15 %
Lymphs Abs: 1.5 K/uL (ref 0.7–4.0)
MCH: 32.5 pg (ref 26.0–34.0)
MCHC: 31.1 g/dL (ref 30.0–36.0)
MCV: 104.3 fL — ABNORMAL HIGH (ref 80.0–100.0)
Monocytes Absolute: 1.2 K/uL — ABNORMAL HIGH (ref 0.1–1.0)
Monocytes Relative: 12 %
Neutro Abs: 7.2 K/uL (ref 1.7–7.7)
Neutrophils Relative %: 67 %
Platelets: 450 K/uL — ABNORMAL HIGH (ref 150–400)
RBC: 3.02 MIL/uL — ABNORMAL LOW (ref 4.22–5.81)
RDW: 17.1 % — ABNORMAL HIGH (ref 11.5–15.5)
WBC: 10.4 K/uL (ref 4.0–10.5)
nRBC: 0 % (ref 0.0–0.2)

## 2024-03-31 LAB — URINALYSIS, ROUTINE W REFLEX MICROSCOPIC
Bilirubin Urine: NEGATIVE
Glucose, UA: NEGATIVE mg/dL
Hgb urine dipstick: NEGATIVE
Ketones, ur: NEGATIVE mg/dL
Leukocytes,Ua: NEGATIVE
Nitrite: NEGATIVE
Protein, ur: NEGATIVE mg/dL
Specific Gravity, Urine: 1.01 (ref 1.005–1.030)
pH: 6 (ref 5.0–8.0)

## 2024-03-31 LAB — BASIC METABOLIC PANEL WITH GFR
Anion gap: 10 (ref 5–15)
BUN: 24 mg/dL — ABNORMAL HIGH (ref 8–23)
CO2: 22 mmol/L (ref 22–32)
Calcium: 8.5 mg/dL — ABNORMAL LOW (ref 8.9–10.3)
Chloride: 106 mmol/L (ref 98–111)
Creatinine, Ser: 2.08 mg/dL — ABNORMAL HIGH (ref 0.61–1.24)
GFR, Estimated: 30 mL/min — ABNORMAL LOW (ref >60)
Glucose, Bld: 114 mg/dL — ABNORMAL HIGH (ref 70–99)
Potassium: 4.3 mmol/L (ref 3.5–5.1)
Sodium: 138 mmol/L (ref 135–145)

## 2024-03-31 MED ORDER — TETANUS-DIPHTH-ACELL PERTUSSIS 5-2-15.5 LF-MCG/0.5 IM SUSP
0.5000 mL | Freq: Once | INTRAMUSCULAR | Status: AC
  Administered 2024-03-31: 02:00:00 0.5 mL via INTRAMUSCULAR
  Filled 2024-03-31: qty 0.5

## 2024-03-31 MED ORDER — ALPRAZOLAM 0.5 MG PO TABS: 0.5000 mg | ORAL_TABLET | Freq: Every day | ORAL | 0 refills | Status: DC

## 2024-03-31 MED ORDER — ALPRAZOLAM 0.5 MG PO TABS: 0.5000 mg | ORAL_TABLET | Freq: Three times a day (TID) | ORAL | 0 refills | Status: DC | PRN

## 2024-03-31 MED ORDER — HALOPERIDOL LACTATE 2 MG/ML PO CONC: 2.0000 mg | Freq: Three times a day (TID) | ORAL | 0 refills | Status: DC | PRN

## 2024-03-31 MED ORDER — QUETIAPINE FUMARATE 50 MG PO TABS: 50.0000 mg | ORAL_TABLET | Freq: Two times a day (BID) | ORAL | 3 refills | Status: DC

## 2024-03-31 MED ORDER — QUETIAPINE FUMARATE 25 MG PO TABS: 50.0000 mg | ORAL_TABLET | Freq: Every day | ORAL | 0 refills | Status: DC
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
## 2024-03-31 NOTE — Discharge Instructions (Signed)
 Patient was seen today for agitation.  His workup is reassuring.  Make sure that he is taking his nighttime medications and dementia medications.

## 2024-03-31 NOTE — ED Notes (Signed)
 [facility] RN called and updated on plan of care.  All questions answered.
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
## 2024-04-01 ENCOUNTER — Other Ambulatory Visit: Payer: Self-pay

## 2024-04-01 ENCOUNTER — Encounter: Payer: Self-pay | Admitting: Adult Health

## 2024-04-01 ENCOUNTER — Emergency Department (HOSPITAL_COMMUNITY)
Admission: EM | Admit: 2024-04-01 | Discharge: 2024-04-04 | Disposition: A | Discharge status: Skilled Nursing Facility | Source: Skilled Nursing Facility | Attending: Emergency Medicine | Admitting: Emergency Medicine

## 2024-04-01 ENCOUNTER — Non-Acute Institutional Stay (SKILLED_NURSING_FACILITY): Payer: Self-pay | Admitting: Adult Health

## 2024-04-01 ENCOUNTER — Encounter (HOSPITAL_COMMUNITY): Payer: Self-pay

## 2024-04-01 DIAGNOSIS — G301 Alzheimer's disease with late onset: Secondary | ICD-10-CM | POA: Diagnosis not present

## 2024-04-01 DIAGNOSIS — S51802D Unspecified open wound of left forearm, subsequent encounter: Secondary | ICD-10-CM | POA: Insufficient documentation

## 2024-04-01 DIAGNOSIS — E039 Hypothyroidism, unspecified: Secondary | ICD-10-CM | POA: Insufficient documentation

## 2024-04-01 DIAGNOSIS — N183 Chronic kidney disease, stage 3 unspecified: Secondary | ICD-10-CM | POA: Diagnosis not present

## 2024-04-01 DIAGNOSIS — X58XXXD Exposure to other specified factors, subsequent encounter: Secondary | ICD-10-CM | POA: Insufficient documentation

## 2024-04-01 DIAGNOSIS — G309 Alzheimer's disease, unspecified: Secondary | ICD-10-CM | POA: Diagnosis not present

## 2024-04-01 DIAGNOSIS — R4587 Impulsiveness: Secondary | ICD-10-CM | POA: Insufficient documentation

## 2024-04-01 DIAGNOSIS — F02C11 Dementia in other diseases classified elsewhere, severe, with agitation: Secondary | ICD-10-CM

## 2024-04-01 DIAGNOSIS — F03918 Unspecified dementia, unspecified severity, with other behavioral disturbance: Secondary | ICD-10-CM | POA: Diagnosis not present

## 2024-04-01 DIAGNOSIS — F03911 Unspecified dementia, unspecified severity, with agitation: Secondary | ICD-10-CM | POA: Diagnosis not present

## 2024-04-01 DIAGNOSIS — R451 Restlessness and agitation: Secondary | ICD-10-CM

## 2024-04-01 DIAGNOSIS — F419 Anxiety disorder, unspecified: Secondary | ICD-10-CM | POA: Insufficient documentation

## 2024-04-01 DIAGNOSIS — Z79899 Other long term (current) drug therapy: Secondary | ICD-10-CM | POA: Diagnosis not present

## 2024-04-01 HISTORY — DX: Dementia in other diseases classified elsewhere, unspecified severity, without behavioral disturbance, psychotic disturbance, mood disturbance, and anxiety: F02.80

## 2024-04-01 LAB — COMPREHENSIVE METABOLIC PANEL WITH GFR
ALT: 12 U/L (ref 0–44)
AST: 27 U/L (ref 15–41)
Albumin: 3.9 g/dL (ref 3.5–5.0)
Alkaline Phosphatase: 117 U/L (ref 38–126)
Anion gap: 10 (ref 5–15)
BUN: 23 mg/dL (ref 8–23)
CO2: 22 mmol/L (ref 22–32)
Calcium: 9 mg/dL (ref 8.9–10.3)
Chloride: 107 mmol/L (ref 98–111)
Creatinine, Ser: 2.18 mg/dL — ABNORMAL HIGH (ref 0.61–1.24)
GFR, Estimated: 29 mL/min — ABNORMAL LOW
Glucose, Bld: 92 mg/dL (ref 70–99)
Potassium: 4.3 mmol/L (ref 3.5–5.1)
Sodium: 140 mmol/L (ref 135–145)
Total Bilirubin: 1.4 mg/dL — ABNORMAL HIGH (ref 0.0–1.2)
Total Protein: 6.2 g/dL — ABNORMAL LOW (ref 6.5–8.1)

## 2024-04-01 LAB — CBC WITH DIFFERENTIAL/PLATELET
Abs Immature Granulocytes: 0.04 K/uL (ref 0.00–0.07)
Basophils Absolute: 0.1 K/uL (ref 0.0–0.1)
Basophils Relative: 1 %
Eosinophils Absolute: 0.3 K/uL (ref 0.0–0.5)
Eosinophils Relative: 3 %
HCT: 33.5 % — ABNORMAL LOW (ref 39.0–52.0)
Hemoglobin: 11 g/dL — ABNORMAL LOW (ref 13.0–17.0)
Immature Granulocytes: 0 %
Lymphocytes Relative: 15 %
Lymphs Abs: 1.5 K/uL (ref 0.7–4.0)
MCH: 32.2 pg (ref 26.0–34.0)
MCHC: 32.8 g/dL (ref 30.0–36.0)
MCV: 98 fL (ref 80.0–100.0)
Monocytes Absolute: 1 K/uL (ref 0.1–1.0)
Monocytes Relative: 10 %
Neutro Abs: 6.8 K/uL (ref 1.7–7.7)
Neutrophils Relative %: 71 %
Platelets: 535 K/uL — ABNORMAL HIGH (ref 150–400)
RBC: 3.42 MIL/uL — ABNORMAL LOW (ref 4.22–5.81)
RDW: 17.2 % — ABNORMAL HIGH (ref 11.5–15.5)
WBC: 9.6 K/uL (ref 4.0–10.5)
nRBC: 0 % (ref 0.0–0.2)

## 2024-04-01 MED ORDER — LEVOTHYROXINE SODIUM 112 MCG PO TABS
112.0000 ug | ORAL_TABLET | Freq: Every day | ORAL | Status: DC
  Administered 2024-04-03 – 2024-04-04 (×2): 112 ug via ORAL
  Filled 2024-04-01 (×3): qty 1

## 2024-04-01 MED ORDER — ALPRAZOLAM 0.5 MG PO TABS
0.5000 mg | ORAL_TABLET | Freq: Every day | ORAL | Status: DC
  Administered 2024-04-01 – 2024-04-02 (×2): 0.5 mg via ORAL
  Filled 2024-04-01 (×2): qty 1

## 2024-04-01 MED ORDER — CITALOPRAM HYDROBROMIDE 10 MG PO TABS
20.0000 mg | ORAL_TABLET | Freq: Every day | ORAL | Status: DC
  Administered 2024-04-01 – 2024-04-03 (×3): 20 mg via ORAL
  Filled 2024-04-01 (×4): qty 2

## 2024-04-01 MED ORDER — DONEPEZIL HCL 23 MG PO TABS
23.0000 mg | ORAL_TABLET | Freq: Every day | ORAL | Status: DC
  Administered 2024-04-01: 23 mg via ORAL
  Filled 2024-04-01: qty 1

## 2024-04-01 MED ORDER — HALOPERIDOL LACTATE 5 MG/ML IJ SOLN
2.0000 mg | Freq: Once | INTRAMUSCULAR | Status: AC
  Administered 2024-04-01: 2 mg via INTRAMUSCULAR
  Filled 2024-04-01: qty 1

## 2024-04-01 MED ORDER — QUETIAPINE FUMARATE 50 MG PO TABS
50.0000 mg | ORAL_TABLET | Freq: Two times a day (BID) | ORAL | Status: DC
  Administered 2024-04-01 – 2024-04-02 (×2): 50 mg via ORAL
  Filled 2024-04-01 (×2): qty 1

## 2024-04-01 MED ORDER — MEMANTINE HCL ER 28 MG PO CP24
28.0000 mg | ORAL_CAPSULE | Freq: Every day | ORAL | Status: DC
  Filled 2024-04-01 (×4): qty 1

## 2024-04-01 MED ORDER — LATANOPROST 0.005 % OP SOLN
1.0000 [drp] | Freq: Every day | OPHTHALMIC | Status: DC
  Administered 2024-04-01 – 2024-04-03 (×2): 1 [drp] via OPHTHALMIC
  Filled 2024-04-01 (×2): qty 2.5
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
## 2024-04-01 NOTE — ED Notes (Addendum)
 Pt trying to get out of bed, pulling at cords and grabbing at staff. MD aware, med ordered. Posey belt and safety mittens also placed on pt

## 2024-04-01 NOTE — BH Assessment (Signed)
 Clinician messaged the pts nurses Oscar, RN and Sanford, CHARITY FUNDRAISER) if pt is able to engage in the TTS assessment.   Per Junior, RN, there's no available rooms at this time, the patient is very restless and confusion and is unable to engage at this time.   Clinician asked RN to let her know when the pt can engage.    [name], MS, [facility], [facility] Triage Specialist [phone]
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
## 2024-04-01 NOTE — ED Triage Notes (Addendum)
 Pt BIB EMS from [facility] for revaluation. Per EMS, staff at [facility] state pt continues to be have behavioral changes, very confused and combative. Pt was seen here 2 days ago for same thing. Per EMS pt has a malodorous smell, AND he is not allowed to come back to [facility].   EMS vitals: BP:130/60 HR:66 R:14 96% RA CBG 101

## 2024-04-02 DIAGNOSIS — F03918 Unspecified dementia, unspecified severity, with other behavioral disturbance: Secondary | ICD-10-CM | POA: Diagnosis not present

## 2024-04-02 LAB — URINALYSIS, ROUTINE W REFLEX MICROSCOPIC
Bilirubin Urine: NEGATIVE
Glucose, UA: NEGATIVE mg/dL
Hgb urine dipstick: NEGATIVE
Ketones, ur: 5 mg/dL — AB
Leukocytes,Ua: NEGATIVE
Nitrite: NEGATIVE
Protein, ur: NEGATIVE mg/dL
Specific Gravity, Urine: 1.014 (ref 1.005–1.030)
pH: 5 (ref 5.0–8.0)

## 2024-04-02 MED ORDER — QUETIAPINE FUMARATE 50 MG PO TABS: 25.0000 mg | ORAL_TABLET | Freq: Two times a day (BID) | ORAL | Status: DC | PRN

## 2024-04-02 MED ORDER — QUETIAPINE FUMARATE 50 MG PO TABS
50.0000 mg | ORAL_TABLET | Freq: Every day | ORAL | Status: DC
  Administered 2024-04-03 – 2024-04-04 (×2): 50 mg via ORAL
  Filled 2024-04-02 (×2): qty 1

## 2024-04-02 MED ORDER — HALOPERIDOL LACTATE 5 MG/ML IJ SOLN
2.0000 mg | Freq: Four times a day (QID) | INTRAMUSCULAR | Status: DC | PRN
  Administered 2024-04-02 (×2): 2 mg via INTRAMUSCULAR
  Filled 2024-04-02 (×2): qty 1

## 2024-04-02 MED ORDER — QUETIAPINE FUMARATE 50 MG PO TABS
75.0000 mg | ORAL_TABLET | Freq: Every day | ORAL | Status: DC
  Administered 2024-04-02 – 2024-04-03 (×2): 75 mg via ORAL
  Filled 2024-04-02: qty 2
  Filled 2024-04-02: qty 1
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
## 2024-04-02 NOTE — ED Provider Notes (Signed)
 Emergency Medicine Observation Re-evaluation Note  Alfred Clark is a 88 y.o. male, seen on rounds today.  Pt initially presented to the ED for complaints of Altered Mental Status and Agitation Currently, the patient is awaiting TTS evaluation.  Physical Exam  BP 134/80   Pulse 65   Temp 98 F (36.7 C)   Resp 16   Ht 5' 10 (1.778 m)   Wt 82.1 kg   SpO2 100%   BMI 25.97 kg/m  Physical Exam Resting and in no acute distress  ED Course / MDM  EKG:   I have reviewed the labs performed to date as well as medications administered while in observation.  Recent changes in the last 24 hours include none.  Plan  Current plan is for TTS evaluation.    Suzette Pac, MD 04/02/24 [phone]

## 2024-04-02 NOTE — ED Notes (Signed)
 Pt attempting to get out of bed. Pt redirected and bowel provided. Pt still fidgeting and trying to get out of bed

## 2024-04-02 NOTE — ED Notes (Signed)
 Patient constantly trying to get out of the bed and put his legs over the rails.

## 2024-04-02 NOTE — ED Notes (Signed)
 Pt fed.

## 2024-04-02 NOTE — ED Notes (Signed)
 Pt asleep. Nurse will administer namenda  soon
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
## 2024-04-02 NOTE — ED Provider Notes (Addendum)
 Given the patients advanced dementia, acute agitation, recent environmental transition, medication nonadherence, and concern for polypharmacy-related behavioral dysregulation, recommend medication simplification and adjustment. Hold memantine  (Namenda ) and donepezil  (Aricept ) during the observation period, as cholinesterase inhibitors and NMDA antagonists may worsen agitation and confusion in the setting of acute behavioral disturbance. Discontinue scheduled and PRN alprazolam  (Xanax ) due to high risk for paradoxical agitation, disinhibition, and delirium in older adults. Continue quetiapine  (Seroquel ) for behavioral stabilization with cautious dose optimization as needed, monitoring for sedation and orthostasis. Continue citalopram  20 mg daily without dose escalation. For severe agitation posing risk to self or staff, recommend haloperidol  2 mg IM PRN at the lowest effective dose. Patient to be monitored overnight for response to medication adjustments prior to return to memory care facility.

## 2024-04-02 NOTE — ED Notes (Addendum)
 Care delay: meds delayed d/t pt resting. Nurse does not want to wake and cause further agitation and confusion

## 2024-04-02 NOTE — BH Assessment (Signed)
 At 0155, clinician messaged pts nurse to see if the opt can engage the clinician is awaiting response.   At 0224, per Junior, RN pt is sleeping. Clinician asked RN to keep her posted to when the pt is able to be assessed.    [name], MS, [facility], [facility] Triage Specialist [phone]

## 2024-04-03 MED ORDER — STERILE WATER FOR INJECTION IJ SOLN
INTRAMUSCULAR | Status: AC
  Administered 2024-04-03: 0.5 mL
  Filled 2024-04-03: qty 10

## 2024-04-03 MED ORDER — ZIPRASIDONE MESYLATE 20 MG IM SOLR
10.0000 mg | Freq: Once | INTRAMUSCULAR | Status: AC
  Administered 2024-04-03: 10 mg via INTRAMUSCULAR
  Filled 2024-04-03: qty 20

## 2024-04-03 NOTE — ED Notes (Signed)
 Pt incontinent of urine. Changed into new brief and peri care performed.

## 2024-04-03 NOTE — ED Notes (Signed)
 Patient in bed resting with eyes closed for duration of shift. Patient has not presented with any combative/aggressive or violent behaviors. Cooperative with staff.
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
## 2024-04-03 NOTE — ED Provider Notes (Addendum)
@   1100 Attempted to contact patient facility [facility] Nurse Manager, Autumn at [phone], no answer will attempt once more.  @ 1130 Called patient son Taylon Coole who stated he went to [facility] facility on yesterday about 1630 and was informed by the staff, that patient could not be accepted back until Monday 04/04/2024 once the DON has come in.   Will continue to follow up.

## 2024-04-03 NOTE — Progress Notes (Signed)
 Patient has been denied by [facility] due to no appropriate beds available. Patient meets BH inpatient criteria per [name], PMHNP. Patient has been faxed out to the following facilities:   [facility]  [address] [place] [address] [phone]  [facility]  [address] [place] [address] [phone]  [facility]  [address] [place] [address] [phone]  [facility]  [address] [place], [place] [address] [phone]  [facility] Patient Placement  [facility], [place] [phone]  [facility]  [address] [place], [place] [address] [phone]  [facility]  [address], [place] [address] [phone]  [facility]  [address], [place] [address] [phone]  [facility]  [address] [place] [address] [phone]  [facility]  [address] [place] [address] [phone]  [facility] EFAX  [address], [place] [phone]  [facility]  [address] [place] [address] [phone]  [facility]  [address] [place], [place] [address] [phone]  [facility]  [address] [place] [address] [phone]  [facility]  [address] [place] [address] [phone]  [facility]  [address] [place] [address] [phone]  [facility]  [address] [place]  [place] [address] [phone]   Bunnie Gallop, MSW, LCSW-A  4:29 PM 04/03/2024

## 2024-04-03 NOTE — ED Notes (Signed)
 Patient corroborative and calm receiving morning medications. Alert with equal chest rise and fall and resting with eyes open at this time.

## 2024-04-03 NOTE — ED Provider Notes (Signed)
 Emergency Medicine Observation Re-evaluation Note  Alfred Clark is a 88 y.o. male, seen on rounds today.  Pt initially presented to the ED for complaints of Altered Mental Status and Agitation Currently, the patient is resting.  Physical Exam  BP 122/84 (BP Location: Right Arm)   Pulse 75   Temp 97.6 F (36.4 C) (Axillary)   Resp 17   Ht 5' 10 (1.778 m)   Wt 82.1 kg   SpO2 100%   BMI 25.97 kg/m  Physical Exam General: Calm Cardiac: Well perfused Lungs: Even respirations Psych: Calm  ED Course / MDM  EKG:EKG Interpretation Date/Time:  Saturday April 02 2024 12:54:14 EST Ventricular Rate:  71 PR Interval:  188 QRS Duration:  82 QT Interval:  388 QTC Calculation: 421 R Axis:   63  Text Interpretation: Normal sinus rhythm Possible Anterior infarct , age undetermined Abnormal ECG No significant change since last tracing Confirmed by Emil Share [phone]) on 04/03/2024 7:30:41 AM  I have reviewed the labs performed to date as well as medications administered while in observation.  Recent changes in the last 24 hours include seen by psychiatry who recommend inpatient. Not stable for discharge at this time.  Plan  Current plan is for inpatient psychiatry.    Darra Fonda MATSU, MD 04/03/24 1344

## 2024-04-04 DIAGNOSIS — F03918 Unspecified dementia, unspecified severity, with other behavioral disturbance: Secondary | ICD-10-CM

## 2024-04-04 MED ORDER — QUETIAPINE FUMARATE 25 MG PO TABS: 25.0000 mg | ORAL_TABLET | Freq: Two times a day (BID) | ORAL | 0 refills | Status: DC | PRN

## 2024-04-04 MED ORDER — QUETIAPINE FUMARATE 50 MG PO TABS: 50.0000 mg | ORAL_TABLET | Freq: Every day | ORAL | 0 refills | Status: DC

## 2024-04-04 MED ORDER — QUETIAPINE FUMARATE 25 MG PO TABS: 75.0000 mg | ORAL_TABLET | Freq: Every day | ORAL | 0 refills | Status: DC

## 2024-04-04 NOTE — Progress Notes (Signed)
 FL2 faxed to [facility]. RN can call report and PTAR can be called.

## 2024-04-04 NOTE — [place] FL2 (Signed)
 " [place]  MEDICAID [id number] LEVEL OF CARE FORM     IDENTIFICATION  Patient Name: Alfred Clark Birthdate: Jul 04, 1935 Sex: male Admission Date (Current Location): 04/01/2024  [facility] and [place] Number:  Producer, Television/film/video and Address:  [facility],  [address], [place] [address]      Provider Number: [id number]  Attending Physician Name and Address:  Ula Prentice SAUNDERS, MD  Relative Name and Phone Number:  Javarious, Elsayed, Emergency Contact  [phone] [facility])    Current Level of Care: Hospital Recommended Level of Care: Memory Care Prior Approval Number:    Date Approved/Denied:   PASRR Number:    Discharge Plan: Other (Comment) (Memory Care)    Current Diagnoses: Patient Active Problem List   Diagnosis Date Noted   Dementia with behavioral disturbance (HCC) 04/02/2024   Anemia of chronic disease 03/31/2024   Acquired hypothyroidism 03/31/2024   Glaucoma 03/31/2024   Gastroesophageal reflux disease without esophagitis 03/31/2024   Elevated serum GGT level 03/31/2024   Benign mass of parotid gland 03/31/2024   Late onset Alzheimer's disease without behavioral disturbance (HCC) 01/12/2020   Word finding difficulty 10/22/2016   Hypercholesterolemia    Closed compression fracture of L3 vertebra (HCC)    Chronic kidney disease (CKD), stage III (moderate) (HCC)    Osteoarthritis of thumb, right    Ocular hypertension, bilateral 07/16/2016   Bilateral sensorineural hearing loss 07/16/2016   Minor opacity of both corneas 02/17/2016   Epiretinal membrane (ERM) of left eye 02/06/2015   Posterior vitreous detachment 09/30/2011   Status post cataract extraction and insertion of intraocular lens 09/30/2011    Orientation RESPIRATION BLADDER Height & Weight     Self  Normal Incontinent Weight: 181 lb (82.1 kg) Height:  5' 10 (177.8 cm)  BEHAVIORAL SYMPTOMS/MOOD NEUROLOGICAL BOWEL NUTRITION STATUS      Incontinent Diet (Regular)  AMBULATORY STATUS  COMMUNICATION OF NEEDS Skin   Supervision Verbally Normal                       Personal Care Assistance Level of Assistance  Bathing, Feeding, Dressing Bathing Assistance: Limited assistance Feeding assistance: Limited assistance Dressing Assistance: Limited assistance     Functional Limitations Info  Sight, Hearing, Speech Sight Info: Adequate Hearing Info: Adequate Speech Info: Adequate    SPECIAL CARE FACTORS FREQUENCY                       Contractures Contractures Info: Not present    Additional Factors Info  Code Status, Allergies Code Status Info: Full Allergies Info: Sulfa Antibiotics           Current Medications (04/04/2024):  This is the current hospital active medication list Current Facility-Administered Medications  Medication Dose Route Frequency Provider Last Rate Last Admin   citalopram  (CELEXA ) tablet 20 mg  20 mg Oral Daily Kingsley, Victoria K, DO   20 mg at 04/03/24 9072   haloperidol  lactate (HALDOL ) injection 2 mg  2 mg Intramuscular Q6H PRN Motley-Mangrum, Jadeka A, PMHNP   2 mg at 04/02/24 2236   latanoprost  (XALATAN ) 0.005 % ophthalmic solution 1 drop  1 drop Both Eyes QHS Kingsley, Victoria K, DO   1 drop at 04/03/24 2221   levothyroxine  (SYNTHROID ) tablet 112 mcg  112 mcg Oral QAC breakfast Kingsley, Victoria K, DO   112 mcg at 04/04/24 9376   memantine  (NAMENDA  XR) 24 hr capsule 28 mg  28 mg Oral Daily  Kingsley, Victoria K, DO       QUEtiapine  (SEROQUEL ) tablet 25 mg  25 mg Oral BID PRN Motley-Mangrum, Jadeka A, PMHNP       QUEtiapine  (SEROQUEL ) tablet 50 mg  50 mg Oral Daily Motley-Mangrum, Jadeka A, PMHNP   50 mg at 04/04/24 0845   QUEtiapine  (SEROQUEL ) tablet 75 mg  75 mg Oral QHS Motley-Mangrum, Jadeka A, PMHNP   75 mg at 04/03/24 2219   Current Outpatient Medications  Medication Sig Dispense Refill   citalopram  (CELEXA ) 20 MG tablet Take 20 mg by mouth daily.     donepezil  (ARICEPT ) 23 MG TABS tablet Take 23 mg by mouth at  bedtime.     haloperidol  (HALDOL ) 2 MG/ML solution Take 1 mL (2 mg total) by mouth every 8 (eight) hours as needed for agitation. 15 mL 0   latanoprost  (XALATAN ) 0.005 % ophthalmic solution Place 1 drop into both eyes nightly for 30 days.     Levothyroxine  Sodium 112 MCG CAPS Take 112 mcg by mouth daily before breakfast.     [Paused] memantine  (NAMENDA  XR) 28 MG CP24 24 hr capsule Take 28 mg by mouth daily.     omeprazole (PRILOSEC) 20 MG capsule Take 20 mg by mouth daily. (Patient not taking: Reported on 04/01/2024)     QUEtiapine  (SEROQUEL ) 25 MG tablet Take 1 tablet (25 mg total) by mouth 2 (two) times daily as needed (agitation). 30 tablet 0   QUEtiapine  (SEROQUEL ) 25 MG tablet Take 3 tablets (75 mg total) by mouth at bedtime. 30 tablet 0   [START ON 04/05/2024] QUEtiapine  (SEROQUEL ) 50 MG tablet Take 1 tablet (50 mg total) by mouth daily. 30 tablet 0     Discharge Medications: Please see discharge summary for a list of discharge medications.  Relevant Imaging Results:  Relevant Lab Results:   Additional Information SSN:  Kari JONETTA Daisy, LCSW     "

## 2024-04-04 NOTE — ED Notes (Addendum)
 [facility] called [facility], pts facility and spoke with Jeoffrey the Nurse Manager [phone]) to inform her that pt is stable and will be psych cleared. Amber also spoke with the provider regarding medication changes and recommendations to manage pts agitation in the future. Per [facility] pt can return to his facility via PTAR once he has been cleared.   [facility] was contacted by Wells Hales, social worker from pts facility requesting an [facility] and for pts discharge summary to be faxed to the facility prior to pts return. [facility] notified [facility] social worker and consult was put in tto complete the FL2. [facility] faxed pts discharge summary to Ginger Slack at [facility].   Chesley Holt, [facility]  04/04/24

## 2024-04-04 NOTE — ED Notes (Signed)
 Patient discharged off unit to facility per provider. Patient alert, no s/s of distress at time of discharge. Discharge information given to [facility] staff. Patient off unit on stretcher. Patient escorted and transported by [facility]

## 2024-04-04 NOTE — Discharge Instructions (Addendum)
 Patient is psychiatrically stable for return to memory care facility with updated medication regimen and continued dementia-focused care.  Hospital Course / Current Status: Patient observed in the ED under medical and psychiatric monitoring No agitation or aggressive behavior observed overnight or during subsequent evaluations Patient remained calm, cooperative, and medication compliant Continues to demonstrate baseline cognitive impairment, including confusion and disorganized thought processes, consistent with advanced dementia No evidence of acute medical instability  Given the patients advanced dementia, acute agitation, recent environmental transition, medication nonadherence, and concern for polypharmacy-related behavioral dysregulation, recommend medication simplification and adjustment. Hold memantine  (Namenda ) and donepezil  (Aricept ) during the observation period, as cholinesterase inhibitors and NMDA antagonists may worsen agitation and confusion in the setting of acute behavioral disturbance. Discontinue scheduled and PRN alprazolam  (Xanax ) due to high risk for paradoxical agitation, disinhibition, and delirium in older adults. Continue quetiapine  (Seroquel ) for behavioral stabilization with cautious dose optimization as needed, monitoring for sedation and orthostasis. Continue citalopram  20 mg daily without dose escalation. For severe agitation posing risk to self or staff, recommend haloperidol  2 mg IM PRN at the lowest effective dose.    Discharge recommendations:  Patient is to take medications as prescribed. Please see information for follow-up appointment with psychiatry and therapy. Please follow up with your primary care provider for all medical related needs.   Therapy: We recommend that patient participate in individual therapy to address mental health concerns.  Medications: The patient or guardian is to contact a medical professional and/or outpatient provider to address any  new side effects that develop. The patient or guardian should update outpatient providers of any new medications and/or medication changes.   Atypical antipsychotics: If you are prescribed an atypical antipsychotic, it is recommended that your height, weight, BMI, blood pressure, fasting lipid panel, and fasting blood sugar be monitored by your outpatient providers.  Safety:  The patient should abstain from use of illicit substances/drugs and abuse of any medications. If symptoms worsen or do not continue to improve or if the patient becomes actively suicidal or homicidal then it is recommended that the patient return to the closest hospital emergency department, the [facility], or call 911 for further evaluation and treatment. National Suicide Prevention Lifeline 1-800-SUICIDE or 9397599072.  About 988 988 offers 24/7 access to trained crisis counselors who can help people experiencing mental health-related distress. People can call or text 988 or chat 988lifeline.org for themselves or if they are worried about a loved one who may need crisis support.  Crisis Mobile: [facility]:                     719-425-4660 (for crisis response 24 hours a day) [facility] Hotline:                                            682-183-5717   Safety Plan Ezzard GORMAN Saupe will reach out to his staff at [facility], spoke with Nursing supervisor Amber, call 911 or call mobile crisis, or go to nearest emergency room if condition worsens or if suicidal thoughts become active Patients' will follow up with [facility] psychiatric provider for outpatient psychiatric services (therapy/medication management).  The suicide prevention education provided includes the following: Suicide risk factors Suicide prevention and interventions National Suicide Hotline telephone number [facility] assessment telephone number [facility] Emergency  [facility] and/or  Residential Mobile Crisis Unit telephone number Request made of family/significant other to:  staff at [facility], spoke with Nursing supervisor Amber Remove weapons (e.g., guns, rifles, knives), all items previously/currently identified as safety concern.   Remove drugs/medications (over the counter, prescriptions, illicit drugs), all items previously/currently identified as a safety concern.

## 2024-04-04 NOTE — ED Provider Notes (Addendum)
 Emergency Medicine Observation Re-evaluation Note  Alfred Clark is a 88 y.o. male, seen on rounds today.  Pt initially presented to the ED for complaints of Altered Mental Status and Agitation Currently, the patient is sleeping.  Physical Exam  BP (!) 147/67 (BP Location: Right Arm)   Pulse 72   Temp (!) 97.3 F (36.3 C) (Oral)   Resp 16   Ht 5' 10 (1.778 m)   Wt 82.1 kg   SpO2 99%   BMI 25.97 kg/m  Physical Exam General: No acute distress, sleeping Lungs: No respiratory distress Psych: Calm, sleeping  ED Course / MDM  EKG:EKG Interpretation Date/Time:  Saturday April 02 2024 12:54:14 EST Ventricular Rate:  71 PR Interval:  188 QRS Duration:  82 QT Interval:  388 QTC Calculation: 421 R Axis:   63  Text Interpretation: Normal sinus rhythm Possible Anterior infarct , age undetermined Abnormal ECG No significant change since last tracing Confirmed by Emil Share [phone]) on 04/03/2024 7:30:41 AM  I have reviewed the labs performed to date as well as medications administered while in observation.  Recent changes in the last 24 hours include none.  Plan  Current plan is for possible discharge back to his memory care unit today.  The patient was evaluated by our psychiatry team who recommended discharge back to his memory care unit.  The patient is discharged with return precautions.    Ula Prentice SAUNDERS, MD 04/04/24 9242    Ula Prentice SAUNDERS, MD 04/04/24 [phone]

## 2024-04-04 NOTE — ED Notes (Addendum)
 [facility] called pts son, Alm to inform him that has been psych cleared and will be returning to his facility this afternoon. [facility] left a HIPAA compliant message to return the call.   Pts son returned the call and was informed that pt is psych cleared and will return to his facility this afternoon. Pts son was appreciative of the call.   Chesley Holt, [facility]  04/04/24
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
## 2024-04-05 ENCOUNTER — Non-Acute Institutional Stay (SKILLED_NURSING_FACILITY): Admitting: Internal Medicine

## 2024-04-05 ENCOUNTER — Encounter: Payer: Self-pay | Admitting: Internal Medicine

## 2024-04-05 DIAGNOSIS — F02C11 Dementia in other diseases classified elsewhere, severe, with agitation: Secondary | ICD-10-CM | POA: Diagnosis not present

## 2024-04-05 DIAGNOSIS — K219 Gastro-esophageal reflux disease without esophagitis: Secondary | ICD-10-CM

## 2024-04-05 DIAGNOSIS — G301 Alzheimer's disease with late onset: Secondary | ICD-10-CM | POA: Diagnosis not present

## 2024-04-05 DIAGNOSIS — N1832 Chronic kidney disease, stage 3b: Secondary | ICD-10-CM | POA: Diagnosis not present

## 2024-04-05 DIAGNOSIS — E039 Hypothyroidism, unspecified: Secondary | ICD-10-CM

## 2024-04-05 MED ORDER — DIVALPROEX SODIUM 125 MG PO DR TAB: DELAYED_RELEASE_TABLET | ORAL | Status: DC
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
## 2024-04-18 ENCOUNTER — Non-Acute Institutional Stay (SKILLED_NURSING_FACILITY): Admitting: Adult Health

## 2024-04-18 ENCOUNTER — Encounter: Payer: Self-pay | Admitting: Adult Health

## 2024-04-18 DIAGNOSIS — R9389 Abnormal findings on diagnostic imaging of other specified body structures: Secondary | ICD-10-CM | POA: Diagnosis not present

## 2024-04-18 DIAGNOSIS — G301 Alzheimer's disease with late onset: Secondary | ICD-10-CM | POA: Diagnosis not present

## 2024-04-18 DIAGNOSIS — F02C11 Dementia in other diseases classified elsewhere, severe, with agitation: Secondary | ICD-10-CM
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
## 2024-04-19 MED ORDER — HALOPERIDOL LACTATE 2 MG/ML PO CONC: 2.0000 mg | ORAL | Status: AC | PRN

## 2024-04-19 NOTE — Addendum Note (Signed)
 Addended by: DARLEAN TAWNI CROME on: 04/19/2024 08:03 AM   Modules accepted: Orders

## 2024-05-02 ENCOUNTER — Non-Acute Institutional Stay (SKILLED_NURSING_FACILITY): Payer: Self-pay | Admitting: Internal Medicine

## 2024-05-02 DIAGNOSIS — G301 Alzheimer's disease with late onset: Secondary | ICD-10-CM | POA: Diagnosis not present

## 2024-05-02 DIAGNOSIS — M25511 Pain in right shoulder: Secondary | ICD-10-CM | POA: Diagnosis not present

## 2024-05-02 DIAGNOSIS — F02C11 Dementia in other diseases classified elsewhere, severe, with agitation: Secondary | ICD-10-CM | POA: Diagnosis not present
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
## 2024-05-03 LAB — BASIC METABOLIC PANEL WITH GFR
BUN: 26 — AB (ref 4–21)
CO2: 25 — AB (ref 13–22)
Chloride: 105 (ref 99–108)
Creatinine: 2 — AB (ref 0.6–1.3)
Glucose: 72
Potassium: 4.5 meq/L (ref 3.5–5.1)
Sodium: 139 (ref 137–147)

## 2024-05-03 LAB — COMPREHENSIVE METABOLIC PANEL WITH GFR
Albumin: 3.1 — AB (ref 3.5–5.0)
Calcium: 8.5 — AB (ref 8.7–10.7)
Globulin: 2.2
eGFR: 33

## 2024-05-03 LAB — CBC AND DIFFERENTIAL
HCT: 31 — AB (ref 41–53)
Hemoglobin: 10.3 — AB (ref 13.5–17.5)
Platelets: 459 10*3/uL — AB (ref 150–400)
WBC: 15.4

## 2024-05-03 LAB — HEPATIC FUNCTION PANEL
ALT: 9 U/L — AB (ref 10–40)
AST: 16 (ref 14–40)
Alkaline Phosphatase: 88 (ref 25–125)
Bilirubin, Total: 0.6

## 2024-05-03 LAB — CBC: RBC: 3.15 — AB (ref 3.87–5.11)

## 2024-05-04 ENCOUNTER — Encounter: Payer: Self-pay | Admitting: Internal Medicine

## 2024-05-12 ENCOUNTER — Encounter: Payer: Self-pay | Admitting: Adult Health

## 2024-05-12 ENCOUNTER — Non-Acute Institutional Stay (SKILLED_NURSING_FACILITY): Payer: Self-pay | Admitting: Adult Health

## 2024-05-12 DIAGNOSIS — E039 Hypothyroidism, unspecified: Secondary | ICD-10-CM | POA: Diagnosis not present

## 2024-05-12 DIAGNOSIS — R634 Abnormal weight loss: Secondary | ICD-10-CM | POA: Diagnosis not present

## 2024-05-12 DIAGNOSIS — R1312 Dysphagia, oropharyngeal phase: Secondary | ICD-10-CM

## 2024-05-12 DIAGNOSIS — F03918 Unspecified dementia, unspecified severity, with other behavioral disturbance: Secondary | ICD-10-CM | POA: Diagnosis not present

## 2024-05-12 DIAGNOSIS — R052 Subacute cough: Secondary | ICD-10-CM

## 2024-05-12 MED ORDER — QUETIAPINE FUMARATE 25 MG PO TABS: 50.0000 mg | ORAL_TABLET | Freq: Two times a day (BID) | ORAL | Status: AC

## 2024-05-12 MED ORDER — DIVALPROEX SODIUM 125 MG PO DR TAB: DELAYED_RELEASE_TABLET | ORAL | Status: AC

## 2024-05-12 MED ORDER — AMOXICILLIN-POT CLAVULANATE 875-125 MG PO TABS: 1.0000 | ORAL_TABLET | Freq: Two times a day (BID) | ORAL | Status: DC
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
## 2024-05-13 ENCOUNTER — Non-Acute Institutional Stay (SKILLED_NURSING_FACILITY): Admitting: Adult Health

## 2024-05-13 ENCOUNTER — Encounter: Payer: Self-pay | Admitting: Adult Health

## 2024-05-13 ENCOUNTER — Other Ambulatory Visit: Payer: Self-pay | Admitting: Adult Health

## 2024-05-13 DIAGNOSIS — J189 Pneumonia, unspecified organism: Secondary | ICD-10-CM | POA: Diagnosis not present

## 2024-05-13 DIAGNOSIS — R1312 Dysphagia, oropharyngeal phase: Secondary | ICD-10-CM

## 2024-05-13 DIAGNOSIS — F03918 Unspecified dementia, unspecified severity, with other behavioral disturbance: Secondary | ICD-10-CM

## 2024-05-13 DIAGNOSIS — N179 Acute kidney failure, unspecified: Secondary | ICD-10-CM

## 2024-05-13 MED ORDER — ZOSYN 2-0.25 GM/50ML IV SOLN: 2.2500 g | Freq: Four times a day (QID) | INTRAVENOUS | 0 refills | Status: AC
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
## 2024-05-14 ENCOUNTER — Encounter: Payer: Self-pay | Admitting: Adult Health

## 2024-05-17 ENCOUNTER — Telehealth: Payer: Self-pay | Admitting: Orthopedic Surgery

## 2024-05-17 ENCOUNTER — Emergency Department (HOSPITAL_COMMUNITY)

## 2024-05-17 ENCOUNTER — Other Ambulatory Visit: Payer: Self-pay

## 2024-05-17 ENCOUNTER — Observation Stay (HOSPITAL_COMMUNITY)
Admission: EM | Admit: 2024-05-17 | Source: Home / Self Care | Attending: Emergency Medicine | Admitting: Emergency Medicine

## 2024-05-17 ENCOUNTER — Encounter (HOSPITAL_COMMUNITY): Payer: Self-pay

## 2024-05-17 DIAGNOSIS — J189 Pneumonia, unspecified organism: Secondary | ICD-10-CM | POA: Diagnosis not present

## 2024-05-17 DIAGNOSIS — N1832 Chronic kidney disease, stage 3b: Secondary | ICD-10-CM | POA: Diagnosis present

## 2024-05-17 DIAGNOSIS — F03918 Unspecified dementia, unspecified severity, with other behavioral disturbance: Secondary | ICD-10-CM | POA: Diagnosis present

## 2024-05-17 DIAGNOSIS — D638 Anemia in other chronic diseases classified elsewhere: Secondary | ICD-10-CM | POA: Diagnosis present

## 2024-05-17 DIAGNOSIS — E039 Hypothyroidism, unspecified: Secondary | ICD-10-CM | POA: Diagnosis present

## 2024-05-17 LAB — RESP PANEL BY RT-PCR (RSV, FLU A&B, COVID)  RVPGX2
Influenza A by PCR: NEGATIVE
Influenza B by PCR: NEGATIVE
Resp Syncytial Virus by PCR: NEGATIVE
SARS Coronavirus 2 by RT PCR: NEGATIVE

## 2024-05-17 LAB — COMPREHENSIVE METABOLIC PANEL WITH GFR
ALT: 12 U/L (ref 0–44)
AST: 31 U/L (ref 15–41)
Albumin: 2.5 g/dL — ABNORMAL LOW (ref 3.5–5.0)
Alkaline Phosphatase: 78 U/L (ref 38–126)
Anion gap: 9 (ref 5–15)
BUN: 27 mg/dL — ABNORMAL HIGH (ref 8–23)
CO2: 24 mmol/L (ref 22–32)
Calcium: 8.4 mg/dL — ABNORMAL LOW (ref 8.9–10.3)
Chloride: 112 mmol/L — ABNORMAL HIGH (ref 98–111)
Creatinine, Ser: 2.03 mg/dL — ABNORMAL HIGH (ref 0.61–1.24)
GFR, Estimated: 31 mL/min — ABNORMAL LOW
Glucose, Bld: 106 mg/dL — ABNORMAL HIGH (ref 70–99)
Potassium: 4.1 mmol/L (ref 3.5–5.1)
Sodium: 145 mmol/L (ref 135–145)
Total Bilirubin: 0.2 mg/dL (ref 0.0–1.2)
Total Protein: 6 g/dL — ABNORMAL LOW (ref 6.5–8.1)

## 2024-05-17 LAB — CBC WITH DIFFERENTIAL/PLATELET
Abs Immature Granulocytes: 5.07 10*3/uL — ABNORMAL HIGH (ref 0.00–0.07)
Basophils Absolute: 0.1 10*3/uL (ref 0.0–0.1)
Basophils Relative: 1 %
Eosinophils Absolute: 1.5 10*3/uL — ABNORMAL HIGH (ref 0.0–0.5)
Eosinophils Relative: 5 %
HCT: 27.5 % — ABNORMAL LOW (ref 39.0–52.0)
Hemoglobin: 8.7 g/dL — ABNORMAL LOW (ref 13.0–17.0)
Immature Granulocytes: 18 %
Lymphocytes Relative: 10 %
Lymphs Abs: 2.7 10*3/uL (ref 0.7–4.0)
MCH: 32 pg (ref 26.0–34.0)
MCHC: 31.6 g/dL (ref 30.0–36.0)
MCV: 101.1 fL — ABNORMAL HIGH (ref 80.0–100.0)
Monocytes Absolute: 2.5 10*3/uL — ABNORMAL HIGH (ref 0.1–1.0)
Monocytes Relative: 9 %
Neutro Abs: 16.1 10*3/uL — ABNORMAL HIGH (ref 1.7–7.7)
Neutrophils Relative %: 57 %
Platelets: 757 10*3/uL — ABNORMAL HIGH (ref 150–400)
RBC: 2.72 MIL/uL — ABNORMAL LOW (ref 4.22–5.81)
RDW: 17.3 % — ABNORMAL HIGH (ref 11.5–15.5)
WBC: 28.1 10*3/uL — ABNORMAL HIGH (ref 4.0–10.5)
nRBC: 0.2 % (ref 0.0–0.2)

## 2024-05-17 LAB — I-STAT CG4 LACTIC ACID, ED: Lactic Acid, Venous: 0.9 mmol/L (ref 0.5–1.9)

## 2024-05-17 LAB — PROTIME-INR
INR: 1.2 (ref 0.8–1.2)
Prothrombin Time: 15.4 s — ABNORMAL HIGH (ref 11.4–15.2)

## 2024-05-17 MED ORDER — SODIUM CHLORIDE 0.9 % IV SOLN
2.0000 g | Freq: Once | INTRAVENOUS | Status: AC
  Administered 2024-05-17: 2 g via INTRAVENOUS
  Filled 2024-05-17: qty 12.5

## 2024-05-17 MED ORDER — CITALOPRAM HYDROBROMIDE 20 MG PO TABS
20.0000 mg | ORAL_TABLET | Freq: Every day | ORAL | Status: AC
  Administered 2024-05-18 – 2024-05-20 (×3): 20 mg via ORAL
  Filled 2024-05-17 (×3): qty 1

## 2024-05-17 MED ORDER — QUETIAPINE FUMARATE 50 MG PO TABS
50.0000 mg | ORAL_TABLET | Freq: Two times a day (BID) | ORAL | Status: AC
  Administered 2024-05-18 – 2024-05-20 (×6): 50 mg via ORAL
  Filled 2024-05-17 (×6): qty 2

## 2024-05-17 MED ORDER — AZITHROMYCIN 250 MG PO TABS
500.0000 mg | ORAL_TABLET | Freq: Every day | ORAL | Status: AC
  Administered 2024-05-18 – 2024-05-20 (×3): 500 mg via ORAL
  Filled 2024-05-17 (×3): qty 2

## 2024-05-17 MED ORDER — DIVALPROEX SODIUM 250 MG PO DR TAB
250.0000 mg | DELAYED_RELEASE_TABLET | Freq: Every day | ORAL | Status: AC
  Administered 2024-05-18 – 2024-05-20 (×3): 250 mg via ORAL
  Filled 2024-05-17 (×2): qty 1
  Filled 2024-05-17: qty 2
  Filled 2024-05-17: qty 1

## 2024-05-17 MED ORDER — DIVALPROEX SODIUM 125 MG PO DR TAB
125.0000 mg | DELAYED_RELEASE_TABLET | Freq: Two times a day (BID) | ORAL | Status: AC
  Administered 2024-05-18 – 2024-05-20 (×5): 125 mg via ORAL
  Filled 2024-05-17 (×6): qty 1

## 2024-05-17 MED ORDER — VANCOMYCIN HCL IN DEXTROSE 1-5 GM/200ML-% IV SOLN
1000.0000 mg | Freq: Once | INTRAVENOUS | Status: AC
  Administered 2024-05-17: 1000 mg via INTRAVENOUS
  Filled 2024-05-17: qty 200

## 2024-05-17 MED ORDER — HEPARIN SODIUM (PORCINE) 5000 UNIT/ML IJ SOLN
5000.0000 [IU] | Freq: Three times a day (TID) | INTRAMUSCULAR | Status: AC
  Administered 2024-05-18 – 2024-05-20 (×9): 5000 [IU] via SUBCUTANEOUS
  Filled 2024-05-17 (×9): qty 1

## 2024-05-17 MED ORDER — ONDANSETRON HCL 4 MG/2ML IJ SOLN: 4.0000 mg | Freq: Four times a day (QID) | INTRAMUSCULAR | Status: AC | PRN

## 2024-05-17 MED ORDER — ONDANSETRON HCL 4 MG PO TABS: 4.0000 mg | ORAL_TABLET | Freq: Four times a day (QID) | ORAL | Status: AC | PRN

## 2024-05-17 MED ORDER — DONEPEZIL HCL 23 MG PO TABS
23.0000 mg | ORAL_TABLET | Freq: Every day | ORAL | Status: AC
  Administered 2024-05-19 – 2024-05-20 (×3): 23 mg via ORAL
  Filled 2024-05-17 (×3): qty 1

## 2024-05-17 MED ORDER — SODIUM CHLORIDE 0.9 % IV SOLN
2.0000 g | INTRAVENOUS | Status: DC
  Administered 2024-05-18 – 2024-05-19 (×2): 2 g via INTRAVENOUS
  Filled 2024-05-17 (×2): qty 20

## 2024-05-17 MED ORDER — SENNOSIDES-DOCUSATE SODIUM 8.6-50 MG PO TABS: 1.0000 | ORAL_TABLET | Freq: Every evening | ORAL | Status: AC | PRN

## 2024-05-17 MED ORDER — ACETAMINOPHEN 325 MG PO TABS
650.0000 mg | ORAL_TABLET | Freq: Four times a day (QID) | ORAL | Status: AC | PRN
  Administered 2024-05-19: 650 mg via ORAL
  Filled 2024-05-17: qty 2

## 2024-05-17 MED ORDER — ACETAMINOPHEN 650 MG RE SUPP: 650.0000 mg | Freq: Four times a day (QID) | RECTAL | Status: AC | PRN

## 2024-05-17 MED ORDER — MEMANTINE HCL ER 28 MG PO CP24
28.0000 mg | ORAL_CAPSULE | Freq: Every day | ORAL | Status: DC
  Filled 2024-05-17 (×3): qty 1

## 2024-05-17 MED ORDER — LEVOTHYROXINE SODIUM 112 MCG PO TABS
112.0000 ug | ORAL_TABLET | Freq: Every day | ORAL | Status: AC
  Administered 2024-05-19 – 2024-05-20 (×2): 112 ug via ORAL
  Filled 2024-05-17 (×2): qty 1

## 2024-05-17 MED ORDER — LACTATED RINGERS IV BOLUS (SEPSIS)
500.0000 mL | Freq: Once | INTRAVENOUS | Status: AC
  Administered 2024-05-17: 500 mL via INTRAVENOUS

## 2024-05-17 NOTE — Hospital Course (Signed)
 Alfred Clark is a 89 y.o. male with medical history significant for Alzheimer's dementia with behavioral disturbance, CKD stage IIIb, anemia, hypothyroidism, HLD who is admitted with pneumonia of bilateral lower lobes.

## 2024-05-17 NOTE — ED Triage Notes (Signed)
 PT bib [facility]. Per EMS PT has been Dx w/ pneumonia and has been taking his Abx but has seen no improvement. EMS stated family said PT had a high WBC.

## 2024-05-18 ENCOUNTER — Observation Stay (HOSPITAL_COMMUNITY)

## 2024-05-18 DIAGNOSIS — J189 Pneumonia, unspecified organism: Secondary | ICD-10-CM | POA: Diagnosis not present

## 2024-05-18 LAB — FOLATE: Folate: 4.5 ng/mL — ABNORMAL LOW

## 2024-05-18 LAB — URINALYSIS, W/ REFLEX TO CULTURE (INFECTION SUSPECTED)
Bacteria, UA: NONE SEEN
Bilirubin Urine: NEGATIVE
Glucose, UA: NEGATIVE mg/dL
Hgb urine dipstick: NEGATIVE
Ketones, ur: NEGATIVE mg/dL
Leukocytes,Ua: NEGATIVE
Nitrite: NEGATIVE
Protein, ur: NEGATIVE mg/dL
Specific Gravity, Urine: 1.025 (ref 1.005–1.030)
pH: 5 (ref 5.0–8.0)

## 2024-05-18 LAB — CBC
HCT: 29.7 % — ABNORMAL LOW (ref 39.0–52.0)
Hemoglobin: 9.3 g/dL — ABNORMAL LOW (ref 13.0–17.0)
MCH: 31.6 pg (ref 26.0–34.0)
MCHC: 31.3 g/dL (ref 30.0–36.0)
MCV: 101 fL — ABNORMAL HIGH (ref 80.0–100.0)
Platelets: 775 10*3/uL — ABNORMAL HIGH (ref 150–400)
RBC: 2.94 MIL/uL — ABNORMAL LOW (ref 4.22–5.81)
RDW: 17.2 % — ABNORMAL HIGH (ref 11.5–15.5)
WBC: 27.4 10*3/uL — ABNORMAL HIGH (ref 4.0–10.5)
nRBC: 0.2 % (ref 0.0–0.2)

## 2024-05-18 LAB — BASIC METABOLIC PANEL WITH GFR
Anion gap: 7 (ref 5–15)
BUN: 26 mg/dL — ABNORMAL HIGH (ref 8–23)
CO2: 26 mmol/L (ref 22–32)
Calcium: 8.3 mg/dL — ABNORMAL LOW (ref 8.9–10.3)
Chloride: 113 mmol/L — ABNORMAL HIGH (ref 98–111)
Creatinine, Ser: 1.89 mg/dL — ABNORMAL HIGH (ref 0.61–1.24)
GFR, Estimated: 34 mL/min — ABNORMAL LOW
Glucose, Bld: 90 mg/dL (ref 70–99)
Potassium: 4.2 mmol/L (ref 3.5–5.1)
Sodium: 145 mmol/L (ref 135–145)

## 2024-05-18 LAB — VITAMIN B12: Vitamin B-12: 1180 pg/mL — ABNORMAL HIGH (ref 180–914)

## 2024-05-18 LAB — STREP PNEUMONIAE URINARY ANTIGEN: Strep Pneumo Urinary Antigen: NEGATIVE

## 2024-05-18 MED ORDER — MEMANTINE HCL 10 MG PO TABS
10.0000 mg | ORAL_TABLET | Freq: Two times a day (BID) | ORAL | Status: AC
  Administered 2024-05-18 – 2024-05-20 (×5): 10 mg via ORAL
  Filled 2024-05-18 (×5): qty 1

## 2024-05-18 MED ORDER — SODIUM CHLORIDE 0.9 % IV SOLN: INTRAVENOUS | Status: AC

## 2024-05-18 MED ORDER — FOLIC ACID 1 MG PO TABS
1.0000 mg | ORAL_TABLET | Freq: Every day | ORAL | Status: AC
  Administered 2024-05-18 – 2024-05-20 (×3): 1 mg via ORAL
  Filled 2024-05-18 (×3): qty 1

## 2024-05-18 NOTE — TOC Initial Note (Addendum)
 Transition of Care [facility]) - Initial/Assessment Note    Patient Details  Name: Alfred Clark MRN: [id number] Date of Birth: 1935/05/19  Transition of Care [facility]) CM/SW Contact:    Doneta Glenys DASEN, RN Phone Number: 05/18/2024, 3:53 PM  Clinical Narrative:                 MOON Completed. P[facility] lives at [facility]. Plan to return.Will need an updated FL2 and PTAR required for transport at discharge. IP CM following  Expected Discharge Plan: Memory Care ([facility]) Barriers to Discharge: Continued Medical Work up   Patient Goals and CMS Choice Patient states their goals for this hospitalization and ongoing recovery are:: Per Alm -son return to [facility]   Choice offered to / list presented to : NA      Expected Discharge Plan and Services In-house Referral: NA Discharge Planning Services: CM Consult   Living arrangements for the past 2 months: Assisted Living Facility                 DME Arranged: N/A DME Agency: NA       HH Arranged: NA HH Agency: NA        Prior Living Arrangements/Services Living arrangements for the past 2 months: Assisted Living Facility Lives with:: Facility Resident Patient language and need for interpreter reviewed:: Yes Do you feel safe going back to the place where you live?:  (Unable to assess)      Need for Family Participation in Patient Care: Yes (Comment) Care giver support system in place?: Yes (comment) Current home services:  (NA) Criminal Activity/Legal Involvement Pertinent to Current Situation/Hospitalization: No - Comment as needed  Activities of Daily Living      Permission Sought/Granted Permission sought to share information with : Case Manager Permission granted to share information with : Yes, Verbal Permission Granted (via Alm)  Share Information with NAME: Bomani, [place], Emergency Contact  [phone]           Emotional Assessment Appearance:: Appears stated  age Attitude/Demeanor/Rapport: Unable to Assess Affect (typically observed): Unable to Assess   Alcohol / Substance Use: Not Applicable Psych Involvement: No (comment)  Admission diagnosis:  Pneumonia due to infectious organism, unspecified laterality, unspecified part of lung [J18.9] Community acquired pneumonia of both lower lobes [J18.9] Patient Active Problem List   Diagnosis Date Noted   Community acquired pneumonia of both lower lobes 05/17/2024   Dementia with behavioral disturbance (HCC) 04/02/2024   Anemia of chronic disease 03/31/2024   Acquired hypothyroidism 03/31/2024   Glaucoma 03/31/2024   Gastroesophageal reflux disease without esophagitis 03/31/2024   Elevated serum GGT level 03/31/2024   Benign mass of parotid gland 03/31/2024   Late onset Alzheimer's disease without behavioral disturbance (HCC) 01/12/2020   Word finding difficulty 10/22/2016   Hypercholesterolemia    Closed compression fracture of L3 vertebra (HCC)    Chronic kidney disease, stage 3b (HCC)    Osteoarthritis of thumb, right    Ocular hypertension, bilateral 07/16/2016   Bilateral sensorineural hearing loss 07/16/2016   Minor opacity of both corneas 02/17/2016   Epiretinal membrane (ERM) of left eye 02/06/2015   Posterior vitreous detachment 09/30/2011   Status post cataract extraction and insertion of intraocular lens 09/30/2011   PCP:  Charlanne Fredia CROME, MD Pharmacy:   [facility] [address] [place] - [address] [place] [address] Phone: [phone] Fax: [phone]  [facility] -  [place], [place] - [address] [place] [address] Phone: [phone] Fax: [phone]     Social Drivers of Health (SDOH) Social History: SDOH Screenings   Food Insecurity: Low Risk (11/17/2022)   Received from [facility]  Housing: Low Risk (11/17/2022)   Received from [facility]   Utilities: Low Risk (11/17/2022)   Received from [facility]  Tobacco Use: Low Risk (05/17/2024)   SDOH Interventions:     Readmission Risk Interventions     No data to display

## 2024-05-18 NOTE — Progress Notes (Addendum)
 " PROGRESS NOTE    Alfred Clark  FMW:[id number] DOB: 02-17-1936 DOA: 05/17/2024 PCP: Charlanne Fredia CROME, MD  Chief Complaint  Patient presents with   Cough   Abnormal Labs    Brief Narrative:   Alfred Clark is Alfred Clark 89 y.o. male with medical history significant for Alzheimer's dementia with behavioral disturbance, CKD stage IIIb, anemia, hypothyroidism, HLD who is admitted with pneumonia of bilateral lower lobes.   Assessment & Plan:   Principal Problem:   Community acquired pneumonia of both lower lobes Active Problems:   Chronic kidney disease, stage 3b (HCC)   Anemia of chronic disease   Acquired hypothyroidism   Dementia with behavioral disturbance (HCC)  Community-acquired pneumonia of bilateral lower lobes of the lungs: Not improving with treatment at his facility (augmentin  -> zosyn ).  There was concern for potential aspiration events.   CXR with bibasilar airspace disease, R>L  COVID, influenza, RSV negative. Add on RVP MRSA PCR pending Negative urine strep Pending legionella Blood cultures pending UA not c/w UTI Persistent impressive leukocytosis - consider CT chest Continue abx with ceftriaxone /azithromycin  for now - adjust abx as indicated - nontoxic appearing.   Macrocytic anemia Folate Deficiency Low folate, elevated B12 Supplement folate   CKD stage IIIb: Renal function stable, continue to monitor.   Alzheimer's dementia with behavioral disturbance: Continue Depakote , Seroquel , Celexa , Namenda , Aricept .  Delirium and fall precautions.   Thrombocytosis: Likely reactive thrombocytosis.  Continue to monitor.   Hypothyroidism: Continue Synthroid .  Dysphagia S/p SLP eval - puree/thin, crush meds - eat when alert ST at facility can decide when ready to upgrade from puree    DVT prophylaxis: heparin  Code Status: DNR Family Communication: son notes he looks better than last Friday - extremely pale then, couldn't hold his head up.  Didn't talk.   Massively improved from them.  Baseline is up and walking without assistive device with caregiver, chatting (but hard of hearing), severe dementia, doesn't remember anything. Disposition:   Status is: Observation The patient will require care spanning > 2 midnights and should be moved to inpatient because: need for continued inpatient care   Consultants:  none  Procedures:  noen  Antimicrobials:  Anti-infectives (From admission, onward)    Start     Dose/Rate Route Frequency Ordered Stop   05/18/24 1000  azithromycin  (ZITHROMAX ) tablet 500 mg        500 mg Oral Daily 05/17/24 2348 05/23/24 0959   05/18/24 0800  cefTRIAXone  (ROCEPHIN ) 2 g in sodium chloride  0.9 % 100 mL IVPB        2 g 200 mL/hr over 30 Minutes Intravenous Every 24 hours 05/17/24 2348 05/23/24 0759   05/17/24 2245  vancomycin  (VANCOCIN ) IVPB 1000 mg/200 mL premix        1,000 mg 200 mL/hr over 60 Minutes Intravenous  Once 05/17/24 2237 05/18/24 0800   05/17/24 2245  ceFEPIme  (MAXIPIME ) 2 g in sodium chloride  0.9 % 100 mL IVPB        2 g 200 mL/hr over 30 Minutes Intravenous  Once 05/17/24 2237 05/18/24 0841       Subjective: Pleasantly confused, no complaints  Objective: Vitals:   05/18/24 0055 05/18/24 0140 05/18/24 0707 05/18/24 1111  BP:  (!) 140/86 109/67 120/63  Pulse:  (!) 59 (!) 58 (!) 59  Resp:  18 18 (!) 22  Temp: 97.6 F (36.4 C) (!) 97.4 F (36.3 C) (!) 97.5 F (36.4 C) (!) 97.5 F (36.4 C)  TempSrc: Axillary Oral  Oral Oral  SpO2:  100%  100%   No intake or output data in the 24 hours ending 05/18/24 1715 There were no vitals filed for this visit.  Examination:  General exam: chronically ill appearing Respiratory system: diminished anterior exam, unlabored - unable to sit up for posterior exam Cardiovascular system: RRR Gastrointestinal system: Abdomen is nondistended, soft and nontender. Central nervous system: Alert, pleasantly confused Extremities: no LEE   Data Reviewed: I  have personally reviewed following labs and imaging studies  CBC: Recent Labs  Lab 05/17/24 2058 05/18/24 0440  WBC 28.1* 27.4*  NEUTROABS 16.1*  --   HGB 8.7* 9.3*  HCT 27.5* 29.7*  MCV 101.1* 101.0*  PLT 757* 775*    Basic Metabolic Panel: Recent Labs  Lab 05/17/24 2058 05/18/24 0440  NA 145 145  K 4.1 4.2  CL 112* 113*  CO2 24 26  GLUCOSE 106* 90  BUN 27* 26*  CREATININE 2.03* 1.89*  CALCIUM 8.4* 8.3*    GFR: Estimated Creatinine Clearance: 29 mL/min (Cray Monnin) (by C-G formula based on SCr of 1.89 mg/dL (H)).  Liver Function Tests: Recent Labs  Lab 05/17/24 2058  AST 31  ALT 12  ALKPHOS 78  BILITOT 0.2  PROT 6.0*  ALBUMIN 2.5*    CBG: No results for input(s): GLUCAP in the last 168 hours.   Recent Results (from the past 240 hours)  Resp panel by RT-PCR (RSV, Flu Jillianne Gamino&B, Covid) Anterior Nasal Swab     Status: None   Collection Time: 05/17/24  8:58 PM   Specimen: Anterior Nasal Swab  Result Value Ref Range Status   SARS Coronavirus 2 by RT PCR NEGATIVE NEGATIVE Final    Comment: (NOTE) SARS-CoV-2 target nucleic acids are NOT DETECTED.  The SARS-CoV-2 RNA is generally detectable in upper respiratory specimens during the acute phase of infection. The lowest concentration of SARS-CoV-2 viral copies this assay can detect is 138 copies/mL. Ulisses Vondrak negative result does not preclude SARS-Cov-2 infection and should not be used as the sole basis for treatment or other patient management decisions. Keylen Uzelac negative result may occur with  improper specimen collection/handling, submission of specimen other than nasopharyngeal swab, presence of viral mutation(s) within the areas targeted by this assay, and inadequate number of viral copies(<138 copies/mL). Mckenzie Toruno negative result must be combined with clinical observations, patient history, and epidemiological information. The expected result is Negative.  Fact Sheet for Patients:  bloggercourse.com  Fact  Sheet for Healthcare Providers:  seriousbroker.it  This test is no t yet approved or cleared by the [place]  FDA and  has been authorized for detection and/or diagnosis of SARS-CoV-2 by FDA under an Emergency Use Authorization (EUA). This EUA will remain  in effect (meaning this test can be used) for the duration of the COVID-19 declaration under Section 564(b)(1) of the Act, 21 U.S.C.section 360bbb-3(b)(1), unless the authorization is terminated  or revoked sooner.       Influenza Amrutha Avera by PCR NEGATIVE NEGATIVE Final   Influenza B by PCR NEGATIVE NEGATIVE Final    Comment: (NOTE) The Xpert Xpress SARS-CoV-2/FLU/RSV plus assay is intended as an aid in the diagnosis of influenza from Nasopharyngeal swab specimens and should not be used as Berna Gitto sole basis for treatment. Nasal washings and aspirates are unacceptable for Xpert Xpress SARS-CoV-2/FLU/RSV testing.  Fact Sheet for Patients: bloggercourse.com  Fact Sheet for Healthcare Providers: seriousbroker.it  This test is not yet approved or cleared by the [place]  FDA and has been authorized for detection and/or  diagnosis of SARS-CoV-2 by FDA under an Emergency Use Authorization (EUA). This EUA will remain in effect (meaning this test can be used) for the duration of the COVID-19 declaration under Section 564(b)(1) of the Act, 21 U.S.C. section 360bbb-3(b)(1), unless the authorization is terminated or revoked.     Resp Syncytial Virus by PCR NEGATIVE NEGATIVE Final    Comment: (NOTE) Fact Sheet for Patients: bloggercourse.com  Fact Sheet for Healthcare Providers: seriousbroker.it  This test is not yet approved or cleared by the [place]  FDA and has been authorized for detection and/or diagnosis of SARS-CoV-2 by FDA under an Emergency Use Authorization (EUA). This EUA will remain in effect  (meaning this test can be used) for the duration of the COVID-19 declaration under Section 564(b)(1) of the Act, 21 U.S.C. section 360bbb-3(b)(1), unless the authorization is terminated or revoked.  Performed at [facility], [address] [place], [place] [address]          Radiology Studies: [facility] Chest Port 1 View Result Date: 05/17/2024 CLINICAL DATA:  Questionable sepsis, cough EXAM: PORTABLE CHEST 1 VIEW COMPARISON:  07/21/2023 FINDINGS: Single frontal view of the chest demonstrates an unremarkable cardiac silhouette. There is bibasilar consolidation, right greater than left. Small right effusion. No pneumothorax. No acute bony abnormalities. IMPRESSION: 1. Bibasilar airspace disease, right greater than left, which could reflect pneumonia. 2. Small right pleural effusion. Electronically Signed   By: Ozell Daring M.D.   On: 05/17/2024 21:04        Scheduled Meds:  azithromycin   500 mg Oral Daily   citalopram   20 mg Oral Daily   divalproex   125 mg Oral BID AC   divalproex   250 mg Oral QHS   donepezil   23 mg Oral QHS   folic acid   1 mg Oral Daily   heparin   5,000 Units Subcutaneous Q8H   levothyroxine   112 mcg Oral Q0600   memantine   10 mg Oral BID   QUEtiapine   50 mg Oral BID   Continuous Infusions:  sodium chloride  75 mL/hr at 05/18/24 0219   cefTRIAXone  (ROCEPHIN )  IV 2 g (05/18/24 0841)     LOS: 0 days    Time spent: over 30 min     Meliton Monte, MD [facility]   To contact the attending provider between 7A-7P or the covering provider during after hours 7P-7A, please log into the web site [email or web address] and access using universal [place] password for that web site. If you do not have the password, please call the hospital operator.  05/18/2024, 5:15 PM    "

## 2024-05-18 NOTE — Progress Notes (Addendum)
 Modified Barium Swallow Study  Patient Details  Name: Alfred Clark MRN: [id number] Date of Birth: 08-26-1935  Today's Date: 05/18/2024  Modified Barium Swallow completed.  Full report located under Chart Review in the Imaging Section.  History of Present Illness Alfred Clark is a 89 y.o. male admitted with with persistent cough, there was concern for possible aspiration. Found to have pneumonia. PMH: Alzheimer's dementia, behavioral disturbance, CKD stage IIIb, anemia, hypothyroidism, HLD.   Clinical Impression Pt exhibits a mild oropharyngeal dysphagia with deficits primarily in oral phase. He had decreased bolus control, slow transit and mild lingual residue. Premature spill to pyriform sinuses with liquid bolus however trigger of swallow was timely. Pt did not allow solid cracker far into oral cavity, no attempt to masticate and expectorated when requested. Laryngeal mobility adequate to prevent penetration or aspiration. He demonstrated adequate sensation to mild-moderate pharyngeal residue and spontaneously performed secondary swallow to effectively reduce. Esophageal scan was unremarkable. Recommend puree and thin liquids, pils crushed, assist with meals, allow time for second swallow. ST will briefly follow. Factors that may increase risk of adverse event in presence of aspiration Noe & Lianne 2021): Reduced cognitive function;Frail or deconditioned  Swallow Evaluation Recommendations Recommendations: PO diet PO Diet Recommendation: Dysphagia 1 (Pureed);Thin liquids (Level 0) Liquid Administration via: Straw;Cup Medication Administration: Crushed with puree Supervision: Staff to assist with self-feeding Swallowing strategies  : Slow rate;Small bites/sips Postural changes: Position pt fully upright for meals Oral care recommendations: Oral care BID (2x/day)      Dustin Olam Bull 05/18/2024,7:38 PM
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
## 2024-05-18 NOTE — Plan of Care (Signed)

## 2024-05-18 NOTE — Progress Notes (Addendum)
 Patient received from Ed with brief noted to be soiled with urine upon arrival. Perineal care provided and primofit placed on patient. Order noted for intermittent catheterization that was placed in ED; per ED nurse, procedure was not completed prior to transfer due to time constraints. Bladder scan performed with result of 0ml. Patient currently NPO. Provider notified and ordered received to initiate fluids.   Alm Judge)- contacted for update and room number assignment; no answer. Voicemail left stating that patient has been admitted and current room number. Patient resting in bed with no acute distress noted.   Admission incomplete due to barriers  listed in H&P.

## 2024-05-19 ENCOUNTER — Inpatient Hospital Stay (HOSPITAL_COMMUNITY)

## 2024-05-19 DIAGNOSIS — J189 Pneumonia, unspecified organism: Secondary | ICD-10-CM | POA: Diagnosis not present

## 2024-05-19 LAB — MRSA NEXT GEN BY PCR, NASAL: MRSA by PCR Next Gen: NOT DETECTED

## 2024-05-19 LAB — RESPIRATORY PANEL BY PCR

## 2024-05-19 LAB — CBC WITH DIFFERENTIAL/PLATELET
Abs Immature Granulocytes: 2.2 10*3/uL — ABNORMAL HIGH (ref 0.00–0.07)
Band Neutrophils: 2 %
Basophils Absolute: 0 10*3/uL (ref 0.0–0.1)
Basophils Relative: 0 %
Eosinophils Absolute: 0.5 10*3/uL (ref 0.0–0.5)
Eosinophils Relative: 2 %
HCT: 29.3 % — ABNORMAL LOW (ref 39.0–52.0)
Hemoglobin: 9.1 g/dL — ABNORMAL LOW (ref 13.0–17.0)
Lymphocytes Relative: 7 %
Lymphs Abs: 1.9 10*3/uL (ref 0.7–4.0)
MCH: 31.4 pg (ref 26.0–34.0)
MCHC: 31.1 g/dL (ref 30.0–36.0)
MCV: 101 fL — ABNORMAL HIGH (ref 80.0–100.0)
Metamyelocytes Relative: 6 %
Monocytes Absolute: 2.7 10*3/uL — ABNORMAL HIGH (ref 0.1–1.0)
Monocytes Relative: 10 %
Myelocytes: 2 %
Neutro Abs: 19.7 10*3/uL — ABNORMAL HIGH (ref 1.7–7.7)
Neutrophils Relative %: 71 %
Platelets: 748 10*3/uL — ABNORMAL HIGH (ref 150–400)
RBC: 2.9 MIL/uL — ABNORMAL LOW (ref 4.22–5.81)
RDW: 17.2 % — ABNORMAL HIGH (ref 11.5–15.5)
WBC: 27 10*3/uL — ABNORMAL HIGH (ref 4.0–10.5)
nRBC: 0.1 % (ref 0.0–0.2)

## 2024-05-19 LAB — COMPREHENSIVE METABOLIC PANEL WITH GFR
ALT: 11 U/L (ref 0–44)
AST: 31 U/L (ref 15–41)
Albumin: 2.2 g/dL — ABNORMAL LOW (ref 3.5–5.0)
Alkaline Phosphatase: 77 U/L (ref 38–126)
Anion gap: 11 (ref 5–15)
BUN: 24 mg/dL — ABNORMAL HIGH (ref 8–23)
CO2: 20 mmol/L — ABNORMAL LOW (ref 22–32)
Calcium: 8.3 mg/dL — ABNORMAL LOW (ref 8.9–10.3)
Chloride: 112 mmol/L — ABNORMAL HIGH (ref 98–111)
Creatinine, Ser: 1.67 mg/dL — ABNORMAL HIGH (ref 0.61–1.24)
GFR, Estimated: 40 mL/min — ABNORMAL LOW
Glucose, Bld: 84 mg/dL (ref 70–99)
Potassium: 3.9 mmol/L (ref 3.5–5.1)
Sodium: 143 mmol/L (ref 135–145)
Total Bilirubin: 0.2 mg/dL (ref 0.0–1.2)
Total Protein: 5.5 g/dL — ABNORMAL LOW (ref 6.5–8.1)

## 2024-05-19 LAB — PHOSPHORUS: Phosphorus: 2.8 mg/dL (ref 2.5–4.6)

## 2024-05-19 LAB — MAGNESIUM: Magnesium: 2.2 mg/dL (ref 1.7–2.4)

## 2024-05-19 MED ORDER — SODIUM CHLORIDE 0.9 % IV SOLN
2.0000 g | Freq: Two times a day (BID) | INTRAVENOUS | Status: AC
  Administered 2024-05-19 – 2024-05-20 (×3): 2 g via INTRAVENOUS
  Filled 2024-05-19 (×3): qty 12.5

## 2024-05-19 MED ORDER — MELATONIN 5 MG PO TABS
5.0000 mg | ORAL_TABLET | Freq: Every evening | ORAL | Status: AC | PRN
  Administered 2024-05-19 – 2024-05-20 (×3): 5 mg via ORAL
  Filled 2024-05-19 (×3): qty 1

## 2024-05-19 NOTE — Plan of Care (Signed)
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
## 2024-05-20 LAB — CBC WITH DIFFERENTIAL/PLATELET
Abs Immature Granulocytes: 3.77 10*3/uL — ABNORMAL HIGH (ref 0.00–0.07)
Basophils Absolute: 0.1 10*3/uL (ref 0.0–0.1)
Basophils Relative: 0 %
Eosinophils Absolute: 1.4 10*3/uL — ABNORMAL HIGH (ref 0.0–0.5)
Eosinophils Relative: 5 %
HCT: 29.5 % — ABNORMAL LOW (ref 39.0–52.0)
Hemoglobin: 9.4 g/dL — ABNORMAL LOW (ref 13.0–17.0)
Immature Granulocytes: 15 %
Lymphocytes Relative: 14 %
Lymphs Abs: 3.5 10*3/uL (ref 0.7–4.0)
MCH: 31.3 pg (ref 26.0–34.0)
MCHC: 31.9 g/dL (ref 30.0–36.0)
MCV: 98.3 fL (ref 80.0–100.0)
Monocytes Absolute: 2.3 10*3/uL — ABNORMAL HIGH (ref 0.1–1.0)
Monocytes Relative: 9 %
Neutro Abs: 14.4 10*3/uL — ABNORMAL HIGH (ref 1.7–7.7)
Neutrophils Relative %: 57 %
Platelets: 775 10*3/uL — ABNORMAL HIGH (ref 150–400)
RBC: 3 MIL/uL — ABNORMAL LOW (ref 4.22–5.81)
RDW: 17.2 % — ABNORMAL HIGH (ref 11.5–15.5)
Smear Review: NORMAL
WBC: 25.4 10*3/uL — ABNORMAL HIGH (ref 4.0–10.5)
nRBC: 0.1 % (ref 0.0–0.2)

## 2024-05-20 LAB — COMPREHENSIVE METABOLIC PANEL WITH GFR
ALT: 12 U/L (ref 0–44)
AST: 30 U/L (ref 15–41)
Albumin: 2.3 g/dL — ABNORMAL LOW (ref 3.5–5.0)
Alkaline Phosphatase: 82 U/L (ref 38–126)
Anion gap: 10 (ref 5–15)
BUN: 22 mg/dL (ref 8–23)
CO2: 21 mmol/L — ABNORMAL LOW (ref 22–32)
Calcium: 8.2 mg/dL — ABNORMAL LOW (ref 8.9–10.3)
Chloride: 111 mmol/L (ref 98–111)
Creatinine, Ser: 1.68 mg/dL — ABNORMAL HIGH (ref 0.61–1.24)
GFR, Estimated: 39 mL/min — ABNORMAL LOW
Glucose, Bld: 78 mg/dL (ref 70–99)
Potassium: 3.9 mmol/L (ref 3.5–5.1)
Sodium: 142 mmol/L (ref 135–145)
Total Bilirubin: <0.2 mg/dL (ref 0.0–1.2)
Total Protein: 5.6 g/dL — ABNORMAL LOW (ref 6.5–8.1)

## 2024-05-20 LAB — CULTURE, BLOOD (ROUTINE X 2)
Culture: NO GROWTH
Culture: NO GROWTH
Special Requests: ADEQUATE
Special Requests: ADEQUATE

## 2024-05-20 LAB — C-REACTIVE PROTEIN: CRP: 3.3 mg/dL — ABNORMAL HIGH

## 2024-05-20 LAB — LEGIONELLA PNEUMOPHILA SEROGP 1 UR AG: L. pneumophila Serogp 1 Ur Ag: NEGATIVE

## 2024-05-20 LAB — IRON AND TIBC
Iron: 92 ug/dL (ref 45–182)
Saturation Ratios: 72 % — ABNORMAL HIGH (ref 17.9–39.5)
TIBC: 127 ug/dL — ABNORMAL LOW (ref 250–450)
UIBC: 36 ug/dL

## 2024-05-20 LAB — PHOSPHORUS: Phosphorus: 2.6 mg/dL (ref 2.5–4.6)

## 2024-05-20 LAB — MAGNESIUM: Magnesium: 2.3 mg/dL (ref 1.7–2.4)

## 2024-05-20 LAB — FERRITIN: Ferritin: 2126 ng/mL — ABNORMAL HIGH (ref 24–336)
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
## 2024-05-20 NOTE — Progress Notes (Addendum)
 Speech Language Pathology  Patient Details Name: Alfred Clark MRN: [id number] DOB: Jul 09, 1935 Today's Date: 05/20/2024 Time:  -     Pt was alert for MBS with oral deficits without penetration or aspiration. Orally expectorated solid. Placed on puree/thin. Checked with RN who reports she, the tech nor hos friend who fed him noted any coughing or dificulty. He has had decreased alertness and intake has been low. Chest CT noted for possible aspiration also suspected on admission. Pt's with dementia have an increased risk due to multiple factors. Recommend eat/drink when alert, upright, feed slow if need assistance or allow pt to self feed. Pt unlikley able to advance from puree this admission and recommend ST at [facility] assess for higher texture if able. ST will sign off at this time. If needs arise, please reconsult.       Olam Bull [employer] [phone]                    05/20/2024, 4:06 PM

## 2024-05-20 NOTE — TOC Progression Note (Signed)
 Transition of Care [facility]) - Progression Note    Patient Details  Name: Alfred Clark MRN: [id number] Date of Birth: 1936-04-08  [facility] CM/SW Contact  Doneta Glenys DASEN, RN Phone Number: 05/20/2024, 3:27 PM  Clinical Narrative:    CM spoke with Marcy Hales . If patient is discharge on the weekend, requested that we call weekend supervisor [phone] or [phone].   Expected Discharge Plan: Memory Care ([facility]) Barriers to Discharge: Continued Medical Work up               Expected Discharge Plan and Services In-house Referral: NA Discharge Planning Services: CM Consult   Living arrangements for the past 2 months: Assisted Living Facility                 DME Arranged: N/A DME Agency: NA       HH Arranged: NA HH Agency: NA         Social Drivers of Health (SDOH) Interventions SDOH Screenings   Food Insecurity: No Food Insecurity (05/18/2024)  Housing: Patient Unable To Answer (05/18/2024)  Transportation Needs: Patient Unable To Answer (05/18/2024)  Utilities: Patient Unable To Answer (05/18/2024)  Social Connections: Patient Unable To Answer (05/18/2024)  Tobacco Use: Low Risk (05/17/2024)    Readmission Risk Interventions     No data to display
